# Patient Record
Sex: Female | Born: 1945 | Race: Black or African American | Hispanic: No | Marital: Single | Smoking: Former smoker
Health system: Southern US, Community
[De-identification: ages and names within clinical notes are randomized; demographics above are authoritative.]

## PROBLEM LIST (undated history)

## (undated) DIAGNOSIS — E559 Vitamin D deficiency, unspecified: Secondary | ICD-10-CM

## (undated) DIAGNOSIS — F419 Anxiety disorder, unspecified: Secondary | ICD-10-CM

## (undated) DIAGNOSIS — F329 Major depressive disorder, single episode, unspecified: Secondary | ICD-10-CM

## (undated) DIAGNOSIS — K219 Gastro-esophageal reflux disease without esophagitis: Secondary | ICD-10-CM

## (undated) DIAGNOSIS — G309 Alzheimer's disease, unspecified: Secondary | ICD-10-CM

## (undated) DIAGNOSIS — R634 Abnormal weight loss: Secondary | ICD-10-CM

## (undated) DIAGNOSIS — E079 Disorder of thyroid, unspecified: Secondary | ICD-10-CM

## (undated) DIAGNOSIS — F39 Unspecified mood [affective] disorder: Secondary | ICD-10-CM

## (undated) DIAGNOSIS — M1990 Unspecified osteoarthritis, unspecified site: Secondary | ICD-10-CM

## (undated) DIAGNOSIS — F0280 Dementia in other diseases classified elsewhere without behavioral disturbance: Secondary | ICD-10-CM

## (undated) DIAGNOSIS — N189 Chronic kidney disease, unspecified: Secondary | ICD-10-CM

## (undated) DIAGNOSIS — I10 Essential (primary) hypertension: Secondary | ICD-10-CM

## (undated) DIAGNOSIS — F0390 Unspecified dementia without behavioral disturbance: Secondary | ICD-10-CM

## (undated) HISTORY — PX: ABDOMINAL HYSTERECTOMY: SHX81

---

## 2003-09-24 ENCOUNTER — Other Ambulatory Visit: Payer: Self-pay

## 2004-02-03 ENCOUNTER — Ambulatory Visit: Payer: Self-pay

## 2007-01-04 ENCOUNTER — Other Ambulatory Visit: Payer: Self-pay

## 2007-01-04 ENCOUNTER — Inpatient Hospital Stay: Payer: Self-pay | Admitting: Internal Medicine

## 2008-07-25 ENCOUNTER — Inpatient Hospital Stay: Payer: Self-pay | Admitting: *Deleted

## 2008-09-01 ENCOUNTER — Emergency Department: Payer: Self-pay | Admitting: Internal Medicine

## 2009-09-21 IMAGING — CR DG CHEST 1V PORT
1 series · 1 of 1 positions shown · non-contrast
Comparison: none

REASON FOR EXAM: cp
COMMENTS:

[view not recorded]
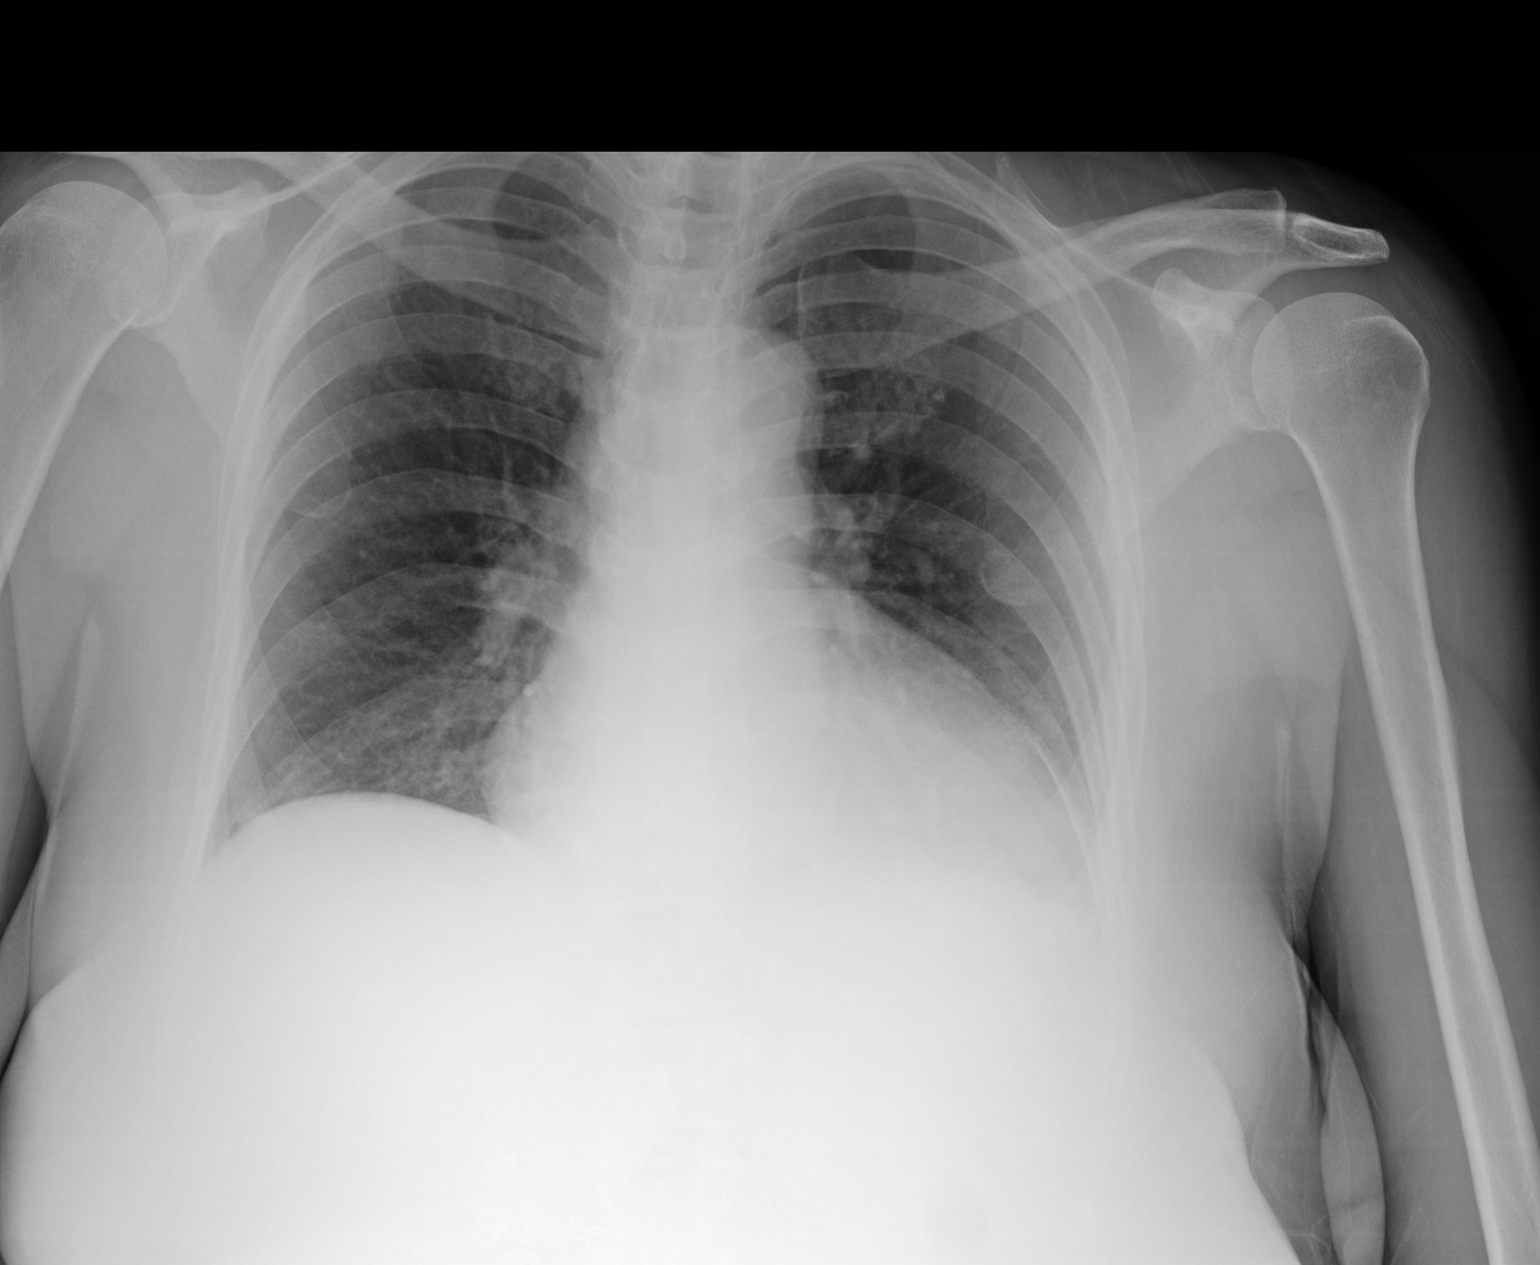

[1 of 1 positions shown; findings below may reference images not displayed]

PROCEDURE:     DXR - DXR PORTABLE CHEST SINGLE VIEW  - September 01, 2008 [DATE]

RESULT:     Comparison is made to a study of 07/25/2008.

There is a density in the left mid lung which is the inferior tip of the
left scapula. The heart is borderline enlarged. There is no edema,
infiltrate, effusion or pneumothorax.
IMPRESSION: No acute cardiopulmonary disease.

## 2010-06-21 ENCOUNTER — Emergency Department: Payer: Self-pay | Admitting: Emergency Medicine

## 2012-05-13 ENCOUNTER — Emergency Department: Payer: Self-pay | Admitting: Emergency Medicine

## 2012-05-13 LAB — URINALYSIS, COMPLETE
Blood: NEGATIVE
Hyaline Cast: 1
Leukocyte Esterase: NEGATIVE
Protein: NEGATIVE
Specific Gravity: 1.01 (ref 1.003–1.030)
Squamous Epithelial: 2
WBC UR: 1 /HPF (ref 0–5)

## 2012-05-13 LAB — COMPREHENSIVE METABOLIC PANEL
Bilirubin,Total: 0.3 mg/dL (ref 0.2–1.0)
Calcium, Total: 9.4 mg/dL (ref 8.5–10.1)
Co2: 25 mmol/L (ref 21–32)
EGFR (Non-African Amer.): 49 — ABNORMAL LOW
Glucose: 103 mg/dL — ABNORMAL HIGH (ref 65–99)
Osmolality: 280 (ref 275–301)
SGOT(AST): 20 U/L (ref 15–37)
Total Protein: 8.2 g/dL (ref 6.4–8.2)

## 2012-05-13 LAB — CBC
HCT: 36.4 % (ref 35.0–47.0)
HGB: 12 g/dL (ref 12.0–16.0)
MCH: 26.6 pg (ref 26.0–34.0)
Platelet: 243 10*3/uL (ref 150–440)
WBC: 7 10*3/uL (ref 3.6–11.0)

## 2012-05-13 LAB — TROPONIN I: Troponin-I: <0.02 ng/mL

## 2012-05-13 LAB — LIPASE, BLOOD: Lipase: 148 U/L (ref 73–393)

## 2012-08-08 ENCOUNTER — Emergency Department: Payer: Self-pay | Admitting: Unknown Physician Specialty

## 2012-08-08 LAB — CBC
HCT: 35.1 % (ref 35.0–47.0)
HGB: 11.7 g/dL — ABNORMAL LOW (ref 12.0–16.0)
MCHC: 33.3 g/dL (ref 32.0–36.0)
MCV: 82 fL (ref 80–100)
Platelet: 218 10*3/uL (ref 150–440)
RBC: 4.3 10*6/uL (ref 3.80–5.20)
RDW: 13.4 % (ref 11.5–14.5)
WBC: 5.9 10*3/uL (ref 3.6–11.0)

## 2012-08-08 LAB — URINALYSIS, COMPLETE
Glucose,UR: NEGATIVE mg/dL (ref 0–75)
Ketone: NEGATIVE
Nitrite: NEGATIVE
Protein: NEGATIVE
Specific Gravity: 1.012 (ref 1.003–1.030)
Squamous Epithelial: 5
WBC UR: 2 /HPF (ref 0–5)

## 2012-08-08 LAB — COMPREHENSIVE METABOLIC PANEL
Alkaline Phosphatase: 117 U/L (ref 50–136)
Anion Gap: 7 (ref 7–16)
Bilirubin,Total: 0.3 mg/dL (ref 0.2–1.0)
Calcium, Total: 9.1 mg/dL (ref 8.5–10.1)
Chloride: 103 mmol/L (ref 98–107)
EGFR (African American): 50 — ABNORMAL LOW
Osmolality: 279 (ref 275–301)
Potassium: 3.3 mmol/L — ABNORMAL LOW (ref 3.5–5.1)
SGOT(AST): 20 U/L (ref 15–37)
Total Protein: 7.8 g/dL (ref 6.4–8.2)

## 2012-08-08 LAB — TROPONIN I: Troponin-I: <0.02 ng/mL

## 2012-08-08 LAB — T4, FREE: Free Thyroxine: 0.98 ng/dL (ref 0.76–1.46)

## 2012-08-08 LAB — PHENYTOIN LEVEL, TOTAL: Dilantin: 4.3 ug/mL — ABNORMAL LOW (ref 10.0–20.0)

## 2012-08-08 LAB — MAGNESIUM: Magnesium: 1.6 mg/dL — ABNORMAL LOW

## 2012-09-10 ENCOUNTER — Emergency Department: Payer: Self-pay | Admitting: Emergency Medicine

## 2012-09-10 LAB — COMPREHENSIVE METABOLIC PANEL
Albumin: 4 g/dL (ref 3.4–5.0)
Anion Gap: 10 (ref 7–16)
Bilirubin,Total: 0.2 mg/dL (ref 0.2–1.0)
Calcium, Total: 9.2 mg/dL (ref 8.5–10.1)
Chloride: 104 mmol/L (ref 98–107)
Co2: 24 mmol/L (ref 21–32)
EGFR (African American): 55 — ABNORMAL LOW
EGFR (Non-African Amer.): 48 — ABNORMAL LOW
Potassium: 3.3 mmol/L — ABNORMAL LOW (ref 3.5–5.1)
SGOT(AST): 17 U/L (ref 15–37)
SGPT (ALT): 15 U/L (ref 12–78)
Total Protein: 7.7 g/dL (ref 6.4–8.2)

## 2012-09-10 LAB — CBC
HGB: 11 g/dL — ABNORMAL LOW (ref 12.0–16.0)
MCH: 27 pg (ref 26.0–34.0)
MCV: 81 fL (ref 80–100)
Platelet: 215 10*3/uL (ref 150–440)
RBC: 4.07 10*6/uL (ref 3.80–5.20)
RDW: 13.6 % (ref 11.5–14.5)

## 2012-09-10 LAB — URINALYSIS, COMPLETE
Bilirubin,UR: NEGATIVE
Glucose,UR: NEGATIVE mg/dL (ref 0–75)
Leukocyte Esterase: NEGATIVE
Nitrite: NEGATIVE
RBC,UR: 1 /HPF (ref 0–5)
Specific Gravity: 1.008 (ref 1.003–1.030)
Squamous Epithelial: <1
WBC UR: <1 /HPF (ref 0–5)

## 2012-09-10 LAB — LIPASE, BLOOD: Lipase: 146 U/L (ref 73–393)

## 2013-11-07 DIAGNOSIS — R630 Anorexia: Secondary | ICD-10-CM | POA: Insufficient documentation

## 2013-11-07 DIAGNOSIS — L309 Dermatitis, unspecified: Secondary | ICD-10-CM | POA: Insufficient documentation

## 2013-11-21 DIAGNOSIS — F329 Major depressive disorder, single episode, unspecified: Secondary | ICD-10-CM | POA: Insufficient documentation

## 2013-11-21 DIAGNOSIS — E559 Vitamin D deficiency, unspecified: Secondary | ICD-10-CM | POA: Insufficient documentation

## 2013-11-21 DIAGNOSIS — F32A Depression, unspecified: Secondary | ICD-10-CM | POA: Insufficient documentation

## 2014-03-25 DIAGNOSIS — K649 Unspecified hemorrhoids: Secondary | ICD-10-CM | POA: Insufficient documentation

## 2014-03-25 DIAGNOSIS — G4700 Insomnia, unspecified: Secondary | ICD-10-CM | POA: Insufficient documentation

## 2014-03-25 DIAGNOSIS — K5900 Constipation, unspecified: Secondary | ICD-10-CM | POA: Insufficient documentation

## 2014-05-19 ENCOUNTER — Emergency Department: Payer: Self-pay | Admitting: Emergency Medicine

## 2014-06-10 ENCOUNTER — Emergency Department: Payer: Self-pay | Admitting: Emergency Medicine

## 2014-06-26 ENCOUNTER — Encounter (HOSPITAL_COMMUNITY): Payer: Self-pay | Admitting: Emergency Medicine

## 2014-06-26 ENCOUNTER — Inpatient Hospital Stay (HOSPITAL_COMMUNITY)
Admission: EM | Admit: 2014-06-26 | Discharge: 2014-07-02 | DRG: 391 | Disposition: A | Discharge status: Skilled Nursing Facility | Payer: Medicare Other | Attending: Family Medicine | Admitting: Family Medicine

## 2014-06-26 DIAGNOSIS — G309 Alzheimer's disease, unspecified: Secondary | ICD-10-CM | POA: Diagnosis present

## 2014-06-26 DIAGNOSIS — E870 Hyperosmolality and hypernatremia: Secondary | ICD-10-CM | POA: Diagnosis present

## 2014-06-26 DIAGNOSIS — Z681 Body mass index (BMI) 19 or less, adult: Secondary | ICD-10-CM

## 2014-06-26 DIAGNOSIS — R1312 Dysphagia, oropharyngeal phase: Principal | ICD-10-CM | POA: Diagnosis present

## 2014-06-26 DIAGNOSIS — N182 Chronic kidney disease, stage 2 (mild): Secondary | ICD-10-CM | POA: Diagnosis present

## 2014-06-26 DIAGNOSIS — F0390 Unspecified dementia without behavioral disturbance: Secondary | ICD-10-CM | POA: Diagnosis present

## 2014-06-26 DIAGNOSIS — R109 Unspecified abdominal pain: Secondary | ICD-10-CM

## 2014-06-26 DIAGNOSIS — K3184 Gastroparesis: Secondary | ICD-10-CM | POA: Diagnosis present

## 2014-06-26 DIAGNOSIS — K219 Gastro-esophageal reflux disease without esophagitis: Secondary | ICD-10-CM | POA: Diagnosis present

## 2014-06-26 DIAGNOSIS — R627 Adult failure to thrive: Secondary | ICD-10-CM

## 2014-06-26 DIAGNOSIS — E039 Hypothyroidism, unspecified: Secondary | ICD-10-CM | POA: Diagnosis present

## 2014-06-26 DIAGNOSIS — M1990 Unspecified osteoarthritis, unspecified site: Secondary | ICD-10-CM | POA: Diagnosis present

## 2014-06-26 DIAGNOSIS — K2960 Other gastritis without bleeding: Secondary | ICD-10-CM | POA: Diagnosis present

## 2014-06-26 DIAGNOSIS — E860 Dehydration: Secondary | ICD-10-CM | POA: Diagnosis present

## 2014-06-26 DIAGNOSIS — D649 Anemia, unspecified: Secondary | ICD-10-CM | POA: Diagnosis present

## 2014-06-26 DIAGNOSIS — E876 Hypokalemia: Secondary | ICD-10-CM | POA: Diagnosis present

## 2014-06-26 DIAGNOSIS — Z90710 Acquired absence of both cervix and uterus: Secondary | ICD-10-CM

## 2014-06-26 DIAGNOSIS — E43 Unspecified severe protein-calorie malnutrition: Secondary | ICD-10-CM | POA: Diagnosis present

## 2014-06-26 DIAGNOSIS — F0281 Dementia in other diseases classified elsewhere with behavioral disturbance: Secondary | ICD-10-CM | POA: Diagnosis present

## 2014-06-26 DIAGNOSIS — F419 Anxiety disorder, unspecified: Secondary | ICD-10-CM | POA: Diagnosis present

## 2014-06-26 DIAGNOSIS — R1084 Generalized abdominal pain: Secondary | ICD-10-CM

## 2014-06-26 DIAGNOSIS — R4702 Dysphasia: Secondary | ICD-10-CM | POA: Diagnosis present

## 2014-06-26 DIAGNOSIS — I129 Hypertensive chronic kidney disease with stage 1 through stage 4 chronic kidney disease, or unspecified chronic kidney disease: Secondary | ICD-10-CM | POA: Diagnosis present

## 2014-06-26 DIAGNOSIS — Z7982 Long term (current) use of aspirin: Secondary | ICD-10-CM

## 2014-06-26 HISTORY — DX: Essential (primary) hypertension: I10

## 2014-06-26 HISTORY — DX: Vitamin D deficiency, unspecified: E55.9

## 2014-06-26 HISTORY — DX: Alzheimer's disease, unspecified: G30.9

## 2014-06-26 HISTORY — DX: Abnormal weight loss: R63.4

## 2014-06-26 HISTORY — DX: Chronic kidney disease, unspecified: N18.9

## 2014-06-26 HISTORY — DX: Disorder of thyroid, unspecified: E07.9

## 2014-06-26 HISTORY — DX: Unspecified osteoarthritis, unspecified site: M19.90

## 2014-06-26 HISTORY — DX: Dementia in other diseases classified elsewhere, unspecified severity, without behavioral disturbance, psychotic disturbance, mood disturbance, and anxiety: F02.80

## 2014-06-26 HISTORY — DX: Gastro-esophageal reflux disease without esophagitis: K21.9

## 2014-06-26 LAB — COMPREHENSIVE METABOLIC PANEL
ALT: 14 U/L (ref 0–35)
AST: 24 U/L (ref 0–37)
Albumin: 4 g/dL (ref 3.5–5.2)
Alkaline Phosphatase: 56 U/L (ref 39–117)
Anion gap: 10 (ref 5–15)
BUN: 27 mg/dL — ABNORMAL HIGH (ref 6–23)
CO2: 27 mmol/L (ref 19–32)
Calcium: 9.2 mg/dL (ref 8.4–10.5)
Chloride: 111 mmol/L (ref 96–112)
Creatinine, Ser: 0.96 mg/dL (ref 0.50–1.10)
GFR calc Af Amer: 68 mL/min — ABNORMAL LOW (ref >90)
GFR calc non Af Amer: 59 mL/min — ABNORMAL LOW (ref >90)
Glucose, Bld: 139 mg/dL — ABNORMAL HIGH (ref 70–99)
Potassium: 3.5 mmol/L (ref 3.5–5.1)
Sodium: 148 mmol/L — ABNORMAL HIGH (ref 135–145)
Total Bilirubin: 0.6 mg/dL (ref 0.3–1.2)
Total Protein: 7.3 g/dL (ref 6.0–8.3)

## 2014-06-26 LAB — CBC WITH DIFFERENTIAL/PLATELET
Basophils Absolute: 0 10*3/uL (ref 0.0–0.1)
Basophils Relative: 0 % (ref 0–1)
Eosinophils Absolute: 0 10*3/uL (ref 0.0–0.7)
Eosinophils Relative: 0 % (ref 0–5)
HCT: 35.5 % — ABNORMAL LOW (ref 36.0–46.0)
Hemoglobin: 11.3 g/dL — ABNORMAL LOW (ref 12.0–15.0)
Lymphocytes Relative: 14 % (ref 12–46)
Lymphs Abs: 0.9 10*3/uL (ref 0.7–4.0)
MCH: 25.7 pg — ABNORMAL LOW (ref 26.0–34.0)
MCHC: 31.8 g/dL (ref 30.0–36.0)
MCV: 80.9 fL (ref 78.0–100.0)
Monocytes Absolute: 0.4 10*3/uL (ref 0.1–1.0)
Monocytes Relative: 7 % (ref 3–12)
Neutro Abs: 4.8 10*3/uL (ref 1.7–7.7)
Neutrophils Relative %: 79 % — ABNORMAL HIGH (ref 43–77)
Platelets: 241 10*3/uL (ref 150–400)
RBC: 4.39 MIL/uL (ref 3.87–5.11)
RDW: 12.6 % (ref 11.5–15.5)
WBC: 6.1 10*3/uL (ref 4.0–10.5)

## 2014-06-26 LAB — URINALYSIS, ROUTINE W REFLEX MICROSCOPIC
Glucose, UA: NEGATIVE mg/dL
Ketones, ur: 15 mg/dL — AB
Leukocytes, UA: NEGATIVE
Nitrite: NEGATIVE
Protein, ur: NEGATIVE mg/dL
Specific Gravity, Urine: 1.025 (ref 1.005–1.030)
Urobilinogen, UA: 0.2 mg/dL (ref 0.0–1.0)
pH: 5.5 (ref 5.0–8.0)

## 2014-06-26 LAB — URINE MICROSCOPIC-ADD ON

## 2014-06-26 LAB — MAGNESIUM: Magnesium: 2.1 mg/dL (ref 1.5–2.5)

## 2014-06-26 MED ORDER — SODIUM CHLORIDE 0.9 % IV SOLN
INTRAVENOUS | Status: DC
  Administered 2014-06-27: 11:00:00 via INTRAVENOUS

## 2014-06-26 MED ORDER — ACETAMINOPHEN 325 MG PO TABS: 650.0000 mg | ORAL_TABLET | Freq: Four times a day (QID) | ORAL | Status: DC | PRN

## 2014-06-26 MED ORDER — ACETAMINOPHEN 650 MG RE SUPP
650.0000 mg | Freq: Four times a day (QID) | RECTAL | Status: DC | PRN
  Administered 2014-06-27 – 2014-06-28 (×2): 650 mg via RECTAL
  Filled 2014-06-26 (×2): qty 1

## 2014-06-26 MED ORDER — DEXTROSE-NACL 5-0.9 % IV SOLN
INTRAVENOUS | Status: DC
  Administered 2014-06-26: 22:00:00 via INTRAVENOUS

## 2014-06-26 MED ORDER — ONDANSETRON HCL 4 MG/2ML IJ SOLN: 4.0000 mg | Freq: Four times a day (QID) | INTRAMUSCULAR | Status: DC | PRN

## 2014-06-26 MED ORDER — LORAZEPAM 2 MG/ML IJ SOLN
0.2500 mg | Freq: Three times a day (TID) | INTRAMUSCULAR | Status: DC | PRN
  Administered 2014-06-27 – 2014-06-30 (×5): 0.25 mg via INTRAVENOUS
  Filled 2014-06-26 (×6): qty 1

## 2014-06-26 MED ORDER — ONDANSETRON HCL 4 MG PO TABS: 4.0000 mg | ORAL_TABLET | Freq: Four times a day (QID) | ORAL | Status: DC | PRN

## 2014-06-26 MED ORDER — LEVOTHYROXINE SODIUM 100 MCG IV SOLR
87.5000 ug | Freq: Every day | INTRAVENOUS | Status: DC
  Administered 2014-06-27 – 2014-06-28 (×2): 87.5 ug via INTRAVENOUS
  Filled 2014-06-26 (×4): qty 5

## 2014-06-26 MED ORDER — SODIUM CHLORIDE 0.9 % IV BOLUS (SEPSIS)
1000.0000 mL | Freq: Once | INTRAVENOUS | Status: AC
  Administered 2014-06-26: 1000 mL via INTRAVENOUS

## 2014-06-26 NOTE — H&P (Signed)
[facility] History and Physical  Dana Grimes ZOX:[id number] DOB: Jan 10, 1946 DOA: 06/26/2014  Referring physician: ER physician. PCP: No primary care provider on file.   History obtained from patient's daughter and son.  Chief Complaint: Poor oral intake.  HPI: Dana Grimes is a 69 y.o. female with history of advanced dementia, hypothyroidism was brought to the ER after patient has been having poor oral intake over the last 2 weeks. As per the family patient did not have any nausea vomiting abdominal pain or diarrhea fever chills chest pain or shortness of breath. At this time patient's family is requesting feeding tube and ER physician had consulted on-call gastroenterologist Dr. Minus Liberty was planning to place PEG tube. Patient has been admitted for further management.   Review of Systems: As presented in the history of presenting illness, rest negative.  Past Medical History  Diagnosis Date  . Hypertension   . Chronic kidney disease   . Thyroid disease     hypothyroidism  . Gastric reflux   . Alzheimer's dementia   . Arthritis   . Vitamin D deficiency   . Abnormal weight loss    Past Surgical History  Procedure Laterality Date  . Abdominal hysterectomy     Social History:  reports that she has never smoked. She does not have any smokeless tobacco history on file. She reports that she does not drink alcohol. Her drug history is not on file. Where does patient live nursing home. Can patient participate in ADLs? No.  No Known Allergies  Family History: History reviewed. No pertinent family history.    Prior to Admission medications   Medication Sig Start Date End Date Taking? Authorizing Provider  ALPRAZolam (XANAX) 0.25 MG tablet Take 0.25 mg by mouth 3 (three) times daily as needed for anxiety.   Yes Historical Provider, MD  aspirin 81 MG chewable tablet Chew 81 mg by mouth daily.   Yes Historical Provider, MD  cholecalciferol (VITAMIN D) 1000 UNITS tablet  Take 1,000 Units by mouth every morning.   Yes Historical Provider, MD  levothyroxine (SYNTHROID, LEVOTHROID) 175 MCG tablet Take 175 mcg by mouth every morning.   Yes Historical Provider, MD  polyethylene glycol powder (GLYCOLAX/MIRALAX) powder Take 17 g by mouth every Monday, Wednesday, and Friday. *Mixed in 8 oz of water/beverage and drink daily on M-W-F   Yes Historical Provider, MD  risperiDONE (RISPERDAL) 2 MG tablet Take 2-4 mg by mouth 2 (two) times daily. Take one tablet daily and two tablets at bedtime   Yes Historical Provider, MD    Physical Exam: Filed Vitals:   06/26/14 1510 06/26/14 1715 06/26/14 1936  BP: 146/83 118/102 132/98  Pulse: 85 73 63  Temp: 98 F (36.7 C)    TempSrc: Oral    Resp: Height:  (1.702 m)    Weight: 49.896 kg (110 lb)    SpO2: 100% 97% 98%     General:  Moderately built and nourished.  Eyes: Anicteric no pallor.  ENT: No discharge from the ears eyes nose and mouth.  Neck: No mass felt.  Cardiovascular: S1-S2 heard.  Respiratory: No rhonchi or crepitations.  Abdomen: Soft nontender bowel sounds present.  Skin: No rash.  Musculoskeletal: No edema.  Psychiatric: Patient is demented.  Neurologic: Alert awake moves all extremities.  Labs on Admission:  Basic Metabolic Panel:  Recent Labs Lab 06/26/14 1750  NA 148*  K 3.5  CL 111  CO2 27  GLUCOSE 139*  BUN  27*  CREATININE 0.96  CALCIUM 9.2  MG 2.1   Liver Function Tests:  Recent Labs Lab 06/26/14 1750  AST 24  ALT 14  ALKPHOS 56  BILITOT 0.6  PROT 7.3  ALBUMIN 4.0   No results for input(s): LIPASE, AMYLASE in the last 168 hours. No results for input(s): AMMONIA in the last 168 hours. CBC:  Recent Labs Lab 06/26/14 1750  WBC 6.1  NEUTROABS 4.8  HGB 11.3*  HCT 35.5*  MCV 80.9  PLT 241   Cardiac Enzymes: No results for input(s): CKTOTAL, CKMB, CKMBINDEX, TROPONINI in the last 168 hours.  BNP (last 3 results) No results for input(s): BNP  in the last 8760 hours.  ProBNP (last 3 results) No results for input(s): PROBNP in the last 8760 hours.  CBG: No results for input(s): GLUCAP in the last 168 hours.  Radiological Exams on Admission: No results found.   Assessment/Plan Principal Problem:   Failure to thrive in adult Active Problems:   Dehydration   Hypernatremia   Dementia   Hypothyroidism   1. Poor by mouth intake with failure to thrive - gastroenterologist Dr. Minus Libertyahman has been consulted and plan is to keep patient nothing by mouth after midnight in anticipation of PEG tube placement. 2. Dehydration with hypernatremia - patient has been placed on D5 normal saline follow metabolic panel. 3. History of dementia - patient is usually on Risperdal which family does not patient to be on. I placed patient on when necessary Ativan for agitation. 4. Anemia - follow CBC. 5. Hypothyroidism - IV Synthroid until patient can take orally.   DVT Prophylaxis SCDs in anticipation of procedure. Code Status: Full code.  Family Communication: Patient's daughter and son at the bedside.  Disposition Plan: Admit for observation.    Dionicio Shelnutt N. [facility] Pager [phone].  If 7PM-7AM, please contact night-coverage [email or web address] Password [email or web address] 06/26/2014, 8:34 PM

## 2014-06-26 NOTE — ED Notes (Signed)
Attempted to place IV in right forearm without success.  Family remains at bedside.

## 2014-06-26 NOTE — ED Notes (Signed)
Per EMS, pt was admitted to [facility] x2 days ago. Per EMS and facility staff, pt not eating and not talking. Per pt family , pt non-verbal and at baseline. CBG 85 en route.

## 2014-06-26 NOTE — ED Provider Notes (Signed)
CSN: [id number]     Arrival date & time 06/26/14  1508 History  This chart was scribed for Raeford Razor, MD by Bronson Curb, ED Scribe. This patient was seen in room APA11/APA11 and the patient's care was started at 4:22 PM.    Chief Complaint  Patient presents with  . Failure To Thrive   LEVEL 5 CAVEAT: DEMENTIA  History provided by: daughter. The history is limited by the condition of the patient. No language interpreter was used.     HPI Comments: Dana Grimes is a 69 y.o. female, with history of HTN, CKD, hypothyroidism, Alzheimer's dementia, who presents to the Emergency Department brought by ambulance for failure to thrive for the past 2 days. Per daughter, patient is from [facility] and for the past week patient has not been eating. She reports she is just starting to take a small amount of fluids. Daughter reports the patient started to decline after being admitted to [facility] 2 days ago, however, she was not eating prior to being admitted. Daughter reports the patient was on risperidone, but states the PA thought she was taking too much and the patient was not given this medication last night. Daughter reports the patient has been awake throughout the night last night. She also reports the patient is no longer holding her head up. She denies vomiting. Daughter states the patient is nonverbal and unable to communicate pain.  Past Medical History  Diagnosis Date  . Hypertension   . Chronic kidney disease   . Thyroid disease     hypothyroidism  . Gastric reflux   . Alzheimer's dementia   . Arthritis   . Vitamin D deficiency   . Abnormal weight loss    History reviewed. No pertinent past surgical history. History reviewed. No pertinent family history. History  Substance Use Topics  . Smoking status: Unknown If Ever Smoked  . Smokeless tobacco: Not on file  . Alcohol Use: No   OB History    No data available     Review of Systems  Unable to perform ROS:  Dementia      Allergies  Review of patient's allergies indicates not on file.  Home Medications   Prior to Admission medications   Not on File   Triage Vitals: BP 146/83 mmHg  Pulse 85  Temp(Src) 98 F (36.7 C) (Oral)  Resp 18  Ht  (1.702 m)  Wt 110 lb (49.896 kg)  BMI 17.22 kg/m2  SpO2 100%  Physical Exam  Constitutional: She appears ill.  Frail and chronically ill appearing. Laying in bed with neck held forward in flexion. Essentially non verbal. Moans/mutters but nonsensical. Does not follow commands.  HENT:  Head: Normocephalic and atraumatic.  Could not adequately view oropharynx. Pt clenching teeth.   Eyes: Conjunctivae are normal. Right eye exhibits no discharge. Left eye exhibits no discharge.  Neck: Neck supple.  Cardiovascular: Normal rate, regular rhythm and normal heart sounds.  Exam reveals no gallop and no friction rub.   No murmur heard. Pulmonary/Chest: Effort normal and breath sounds normal. No respiratory distress.  Abdominal: Soft. She exhibits no distension. There is no tenderness.  Musculoskeletal: She exhibits no edema or tenderness.  Skin: Skin is warm and dry.  Psychiatric: She has a normal mood and affect. Her behavior is normal. Thought content normal.  Nursing note and vitals reviewed.   ED Course  Procedures (including critical care time)  DIAGNOSTIC STUDIES: Oxygen Saturation is 100% on room air, normal  by my interpretation.    COORDINATION OF CARE: 4:34 PM - Discussed treatment plan with daughter at bedside which includes possible admission and daughter agreed to plan.   Labs Review Labs Reviewed  CBC WITH DIFFERENTIAL/PLATELET - Abnormal; Notable for the following:    Hemoglobin 11.3 (*)    HCT 35.5 (*)    MCH 25.7 (*)    Neutrophils Relative % 79 (*)    All other components within normal limits  COMPREHENSIVE METABOLIC PANEL - Abnormal; Notable for the following:    Sodium 148 (*)    Glucose, Bld 139 (*)    BUN 27 (*)     GFR calc non Af Amer 59 (*)    GFR calc Af Amer 68 (*)    All other components within normal limits  URINALYSIS, ROUTINE W REFLEX MICROSCOPIC - Abnormal; Notable for the following:    Hgb urine dipstick TRACE (*)    Bilirubin Urine SMALL (*)    Ketones, ur 15 (*)    All other components within normal limits  URINE MICROSCOPIC-ADD ON - Abnormal; Notable for the following:    Casts HYALINE CASTS (*)    All other components within normal limits  MAGNESIUM    Imaging Review No results found.   EKG Interpretation None      MDM   Final diagnoses:  Dehydration  Failure to thrive in adult    69yF with advanced dementia and failure to thrive. Long discussion with daughter at bedside concerning goals of care. Unfortunately, cannot "fix" underlying issue and this is likely a manifestation of its progression. Daughter not interested in hospice care at this time. Requesting IV placement and IVF. Likely only temporizing measure and will likely end up needing feeding tube. Daughter says she has had previous discussions with siblings and they would like to pursue this. Will discuss with GI. Not currently establish with gastroenterology. With it now being Friday evening, I'm not sure how expeditiously she can be evaluated as an outpatient and then have procedure scheduled. Will discuss with GI.  Discussed with Dr Karilyn Cotaehman, GI. He can placed PEG tomorrow. Requesting NPO after midnight and no heparin.   I personally preformed the services scribed in my presence. The recorded information has been reviewed is accurate. Raeford RazorStephen Lidie Glade, MD.     Raeford RazorStephen Michaelah Credeur, MD 06/26/14 [phone]

## 2014-06-26 NOTE — ED Notes (Signed)
Patient is resting comfortably. 

## 2014-06-27 ENCOUNTER — Encounter (HOSPITAL_COMMUNITY)
Admission: EM | Disposition: A | Discharge status: Skilled Nursing Facility | Payer: Self-pay | Source: Home / Self Care | Attending: Internal Medicine

## 2014-06-27 DIAGNOSIS — E860 Dehydration: Secondary | ICD-10-CM

## 2014-06-27 DIAGNOSIS — E870 Hyperosmolality and hypernatremia: Secondary | ICD-10-CM

## 2014-06-27 DIAGNOSIS — F0390 Unspecified dementia without behavioral disturbance: Secondary | ICD-10-CM

## 2014-06-27 DIAGNOSIS — K2950 Unspecified chronic gastritis without bleeding: Secondary | ICD-10-CM

## 2014-06-27 DIAGNOSIS — R627 Adult failure to thrive: Secondary | ICD-10-CM | POA: Diagnosis not present

## 2014-06-27 DIAGNOSIS — R1312 Dysphagia, oropharyngeal phase: Secondary | ICD-10-CM

## 2014-06-27 DIAGNOSIS — E43 Unspecified severe protein-calorie malnutrition: Secondary | ICD-10-CM | POA: Insufficient documentation

## 2014-06-27 DIAGNOSIS — R634 Abnormal weight loss: Secondary | ICD-10-CM

## 2014-06-27 HISTORY — PX: PEG PLACEMENT: SHX5437

## 2014-06-27 LAB — CBC WITH DIFFERENTIAL/PLATELET
Basophils Absolute: 0 10*3/uL (ref 0.0–0.1)
Basophils Relative: 0 % (ref 0–1)
EOS ABS: 0.1 10*3/uL (ref 0.0–0.7)
Eosinophils Relative: 1 % (ref 0–5)
HEMATOCRIT: 32.5 % — AB (ref 36.0–46.0)
Hemoglobin: 10.4 g/dL — ABNORMAL LOW (ref 12.0–15.0)
LYMPHS ABS: 1 10*3/uL (ref 0.7–4.0)
Lymphocytes Relative: 15 % (ref 12–46)
MCH: 25.8 pg — ABNORMAL LOW (ref 26.0–34.0)
MCHC: 32 g/dL (ref 30.0–36.0)
MCV: 80.6 fL (ref 78.0–100.0)
MONO ABS: 0.6 10*3/uL (ref 0.1–1.0)
MONOS PCT: 9 % (ref 3–12)
NEUTROS ABS: 5 10*3/uL (ref 1.7–7.7)
Neutrophils Relative %: 75 % (ref 43–77)
Platelets: 203 10*3/uL (ref 150–400)
RBC: 4.03 MIL/uL (ref 3.87–5.11)
RDW: 12.6 % (ref 11.5–15.5)
WBC: 6.6 10*3/uL (ref 4.0–10.5)

## 2014-06-27 LAB — COMPREHENSIVE METABOLIC PANEL
ALBUMIN: 3.3 g/dL — AB (ref 3.5–5.2)
ALT: 12 U/L (ref 0–35)
AST: 19 U/L (ref 0–37)
Alkaline Phosphatase: 49 U/L (ref 39–117)
Anion gap: 6 (ref 5–15)
BUN: 18 mg/dL (ref 6–23)
CO2: 27 mmol/L (ref 19–32)
Calcium: 8.7 mg/dL (ref 8.4–10.5)
Chloride: 116 mmol/L — ABNORMAL HIGH (ref 96–112)
Creatinine, Ser: 0.83 mg/dL (ref 0.50–1.10)
GFR calc Af Amer: 82 mL/min — ABNORMAL LOW (ref >90)
GFR calc non Af Amer: 70 mL/min — ABNORMAL LOW (ref >90)
GLUCOSE: 111 mg/dL — AB (ref 70–99)
Potassium: 3.5 mmol/L (ref 3.5–5.1)
Sodium: 149 mmol/L — ABNORMAL HIGH (ref 135–145)
TOTAL PROTEIN: 6 g/dL (ref 6.0–8.3)
Total Bilirubin: 0.6 mg/dL (ref 0.3–1.2)

## 2014-06-27 LAB — SURGICAL PCR SCREEN
MRSA, PCR: NEGATIVE
STAPHYLOCOCCUS AUREUS: NEGATIVE

## 2014-06-27 LAB — GLUCOSE, CAPILLARY
GLUCOSE-CAPILLARY: 88 mg/dL (ref 70–99)
Glucose-Capillary: 104 mg/dL — ABNORMAL HIGH (ref 70–99)
Glucose-Capillary: 104 mg/dL — ABNORMAL HIGH (ref 70–99)
Glucose-Capillary: 90 mg/dL (ref 70–99)

## 2014-06-27 LAB — TSH: TSH: 0.013 u[IU]/mL — AB (ref 0.350–4.500)

## 2014-06-27 LAB — PROTIME-INR
INR: 1.29 (ref 0.00–1.49)
Prothrombin Time: 16.3 seconds — ABNORMAL HIGH (ref 11.6–15.2)

## 2014-06-27 SURGERY — INSERTION, PEG TUBE
Anesthesia: Moderate Sedation

## 2014-06-27 MED ORDER — CEFAZOLIN SODIUM 1-5 GM-% IV SOLN
1.0000 g | Freq: Once | INTRAVENOUS | Status: AC
  Administered 2014-06-27: 1 g via INTRAVENOUS
  Filled 2014-06-27: qty 50

## 2014-06-27 MED ORDER — DEXTROSE-NACL 5-0.45 % IV SOLN: INTRAVENOUS | Status: DC

## 2014-06-27 MED ORDER — MIDAZOLAM HCL 5 MG/5ML IJ SOLN
INTRAMUSCULAR | Status: DC | PRN
  Administered 2014-06-27 (×2): 1 mg via INTRAVENOUS

## 2014-06-27 MED ORDER — SODIUM CHLORIDE 0.9 % IR SOLN
50.0000 mL | Status: DC
  Administered 2014-06-27: 50 mL

## 2014-06-27 MED ORDER — MEPERIDINE HCL 50 MG/ML IJ SOLN
INTRAMUSCULAR | Status: AC
  Filled 2014-06-27: qty 1

## 2014-06-27 MED ORDER — DEXTROSE-NACL 5-0.45 % IV SOLN
INTRAVENOUS | Status: DC
  Administered 2014-06-27 – 2014-06-28 (×2): via INTRAVENOUS

## 2014-06-27 MED ORDER — MIDAZOLAM HCL 5 MG/5ML IJ SOLN
INTRAMUSCULAR | Status: AC
  Filled 2014-06-27: qty 10

## 2014-06-27 NOTE — Progress Notes (Signed)
[facility] PROGRESS NOTE  Dana Grimes UJW:[id number] DOB: 10/30/1945 DOA: 06/26/2014 PCP: No primary care provider on file.  Assessment/Plan: 1. Failure to thrive with poor by mouth intake. Status post PEG tube placement byGastroenterology. Will start using to overfeeding once cleared by gastroenterology. 2. Dehydration. Continue IV fluids 3. Hypernatremia. Will change IV fluids to D5 half-normal saline. 4. Severe malnutrition. Being seen by nutrition 5. Hypothyroidism, on IV Synthroid until able to use PEG tube. 6. Advanced dementia.  Code Status: full code Family Communication: no family present Disposition Plan: discharge home once improved   Consultants:  Gastroenterology, Dr. Karilyn Cotaehman  Procedures:  PEG tube placement 3/26  Antibiotics:    HPI/Subjective: Patient does not open eyes to voice, speech is incoherent  Objective: Filed Vitals:   06/27/14 1520  BP: 137/68  Pulse: 54  Temp:   Resp: 16    Intake/Output Summary (Last 24 hours) at 06/27/14 1850 Last data filed at 06/27/14 1300  Gross per 24 hour  Intake      0 ml  Output      0 ml  Net      0 ml   Filed Weights   06/26/14 1510 06/27/14 0454  Weight: 49.896 kg (110 lb) 41.6 kg (91 lb 11.4 oz)    Exam:   General:  NAD, confused  Cardiovascular: s1, s2,rrr  Respiratory: cta b  Abdomen: soft, nt, bs+, PEG tube in place  Musculoskeletal: no edema b/l   Data Reviewed: Basic Metabolic Panel:  Recent Labs Lab 06/26/14 1750 06/27/14 0657  NA 148* 149*  K 3.5 3.5  CL 111 116*  CO2 27 27  GLUCOSE 139* 111*  BUN 27* 18  CREATININE 0.96 0.83  CALCIUM 9.2 8.7  MG 2.1  --    Liver Function Tests:  Recent Labs Lab 06/26/14 1750 06/27/14 0657  AST 24 19  ALT 14 12  ALKPHOS 56 49  BILITOT 0.6 0.6  PROT 7.3 6.0  ALBUMIN 4.0 3.3*   No results for input(s): LIPASE, AMYLASE in the last 168 hours. No results for input(s): AMMONIA in the last 168 hours. CBC:  Recent  Labs Lab 06/26/14 1750 06/27/14 0657  WBC 6.1 6.6  NEUTROABS 4.8 5.0  HGB 11.3* 10.4*  HCT 35.5* 32.5*  MCV 80.9 80.6  PLT 241 203   Cardiac Enzymes: No results for input(s): CKTOTAL, CKMB, CKMBINDEX, TROPONINI in the last 168 hours. BNP (last 3 results) No results for input(s): BNP in the last 8760 hours.  ProBNP (last 3 results) No results for input(s): PROBNP in the last 8760 hours.  CBG:  Recent Labs Lab 06/27/14 0009 06/27/14 0700 06/27/14 0723 06/27/14 1146  GLUCAP 104* 90 88 104*    Recent Results (from the past 240 hour(s))  Surgical pcr screen     Status: None   Collection Time: 06/27/14  4:40 AM  Result Value Ref Range Status   MRSA, PCR NEGATIVE NEGATIVE Final   Staphylococcus aureus NEGATIVE NEGATIVE Final    Comment:        The Xpert SA Assay (FDA approved for NASAL specimens in patients over 69 years of age), is one component of a comprehensive surveillance program.  Test performance has been validated by [facility] for patients greater than or equal to 69 year old. It is not intended to diagnose infection nor to guide or monitor treatment.      Studies: No results found.  Scheduled Meds: . levothyroxine  87.5 mcg Intravenous Daily  .  midazolam       Continuous Infusions: . dextrose 5 % and 0.45% NaCl 75 mL/hr at 06/27/14 1430  . sodium chloride irrigation 50 mL (06/27/14 1801)    Principal Problem:   Failure to thrive in adult Active Problems:   Dehydration   Hypernatremia   Dementia   Hypothyroidism   Protein-calorie malnutrition, severe    Time spent:    [facility]  [facility] Pager [phone]. If 7PM-7AM, please contact night-coverage at [email or web address], password [facility] 06/27/2014, 6:50 PM  LOS: 1 day

## 2014-06-27 NOTE — Consult Note (Signed)
Referring Provider: Raeford RazorStephen Kohut, MD Primary Care Physician:  No primary care provider on file. Primary Gastroenterologist:  Dr. Karilyn Cotaehman  Reason for Consultation:    Oropharyngeal dysphagia and weight loss. Failure to thrive.  HPI:   Patient is 69 year old African-American female with 6 year history of dementia and [facility] and lately had [facility]. Patient's condition has been gradually deteriorating. According to her daughter Zella BallRobin she has lost close to 60 pounds in the last 5 years. Weight loss has accelerated over the last few weeks. About 2 weeks ago she quit eating. [facility] Staff was able to force her to take her Synthroid but oral intake has significantly decreased and finally diminished over the last few days. Patient was brought to emergency room yesterday and felt to be dehydrated with mild hypernatremia. Family has requested placement of gastrostomy tube for feeding purposes. According to her daughter Zella BallRobin this has been reviewed in the past. There is no history of nausea vomiting melena or rectal bleeding. There is also no history of fever. Patient has undergone EGD in 2010 and biopsy was negative for H. Pylori. She underwent anoscopy and 2007 and also in April 2010. Patient has 2 daughters and 1 son. Her daughter Zella BallRobin is at bedside.    Past Medical History  Diagnosis Date  . Hypertension   . Chronic kidney disease   . Thyroid disease     hypothyroidism  . Gastric reflux   . Alzheimer's dementia   . Arthritis   . Vitamin D deficiency   . Abnormal weight loss     Past Surgical History  Procedure Laterality Date  . Abdominal hysterectomy      Prior to Admission medications   Medication Sig Start Date End Date Taking? Authorizing Provider  ALPRAZolam (XANAX) 0.25 MG tablet Take 0.25 mg by mouth 3 (three) times daily as needed for anxiety.   Yes  Historical Provider, MD  aspirin 81 MG chewable tablet Chew 81 mg by mouth daily.   Yes Historical Provider, MD  cholecalciferol (VITAMIN D) 1000 UNITS tablet Take 1,000 Units by mouth every morning.   Yes Historical Provider, MD  levothyroxine (SYNTHROID, LEVOTHROID) 175 MCG tablet Take 175 mcg by mouth every morning.   Yes Historical Provider, MD  polyethylene glycol powder (GLYCOLAX/MIRALAX) powder Take 17 g by mouth every Monday, Wednesday, and Friday. *Mixed in 8 oz of water/beverage and drink daily on M-W-F   Yes Historical Provider, MD  risperiDONE (RISPERDAL) 2 MG tablet Take 2-4 mg by mouth 2 (two) times daily. Take one tablet daily and two tablets at bedtime   Yes Historical Provider, MD    Current Facility-Administered Medications  Medication Dose Route Frequency Provider Last Rate Last Dose  . 0.9 %  sodium chloride infusion   Intravenous Continuous Raeford RazorStephen Kohut, MD      . acetaminophen (TYLENOL) tablet 650 mg  650 mg Oral Q6H PRN Eduard ClosArshad N Kakrakandy, MD       Or  . acetaminophen (TYLENOL) suppository 650 mg  650 mg Rectal Q6H PRN Eduard ClosArshad N Kakrakandy, MD      . ceFAZolin (ANCEF) IVPB 1 g/50 mL premix  1 g Intravenous Once Malissa HippoNajeeb U Sible Straley, MD      . dextrose 5 %-0.9 % sodium chloride infusion   Intravenous Continuous Eduard ClosArshad N Kakrakandy, MD 75 mL/hr at 06/26/14 2141    . levothyroxine (SYNTHROID, LEVOTHROID)  injection 87.5 mcg  87.5 mcg Intravenous Daily Eduard Clos, MD   87.5 mcg at 06/26/14 2130  . LORazepam (ATIVAN) injection 0.25 mg  0.25 mg Intravenous Q8H PRN Eduard Clos, MD      . ondansetron [facility]) tablet 4 mg  4 mg Oral Q6H PRN Eduard Clos, MD       Or  . ondansetron [facility]) injection 4 mg  4 mg Intravenous Q6H PRN Eduard Clos, MD        Allergies as of 06/26/2014  . (No Known Allergies)    History reviewed. No pertinent family history.  History   Social History  . Marital Status: Single    Spouse Name: N/A  . Number of  Children: N/A  . Years of Education: N/A   Occupational History  . Not on file.   Social History Main Topics  . Smoking status: Never Smoker   . Smokeless tobacco: Not on file  . Alcohol Use: No  . Drug Use: Not on file  . Sexual Activity: Not on file   Other Topics Concern  . Not on file   Social History Narrative  . No narrative on file    Review of Systems: See HPI, otherwise normal ROS  Physical Exam: Temp:  [97.6 F (36.4 C)-98 F (36.7 C)] 97.6 F (36.4 C) (03/26 0454) Pulse Rate:  [52-85] 64 (03/26 0454) Resp:  [18-22] 18 (03/26 0454) BP: (102-156)/(75-102) 156/78 mmHg (03/26 0454) SpO2:  [97 %-100 %] 100 % (03/26 0454) Weight:  [91 lb 11.4 oz (41.6 kg)-110 lb (49.896 kg)] 91 lb 11.4 oz (41.6 kg) (03/26 0454)   Patient is well-developed thin African-American female who does not respond to local stimuli. She does verbalize but her speech is difficult to comprehend. Conjunctiva is pink. Sclera is nonicteric. Pupils are small and equal. Oropharyngeal mucosa could not be examined. Neck his to pull. No thyromegaly or lymphadenopathy. Cardiac exam with regular rhythm normal S1 and S2. No murmur or gallop noted. Lungs are clear to auscultation. Abdomen is flat with redundant skin. Bowel sounds are normal. She has lower midline scar. On palpation abdomen is soft and nontender without organomegaly or masses. Extremities are thin but no edema or clubbing noted.   Lab Results:  Recent Labs  06/26/14 1750 06/27/14 0657  WBC 6.1 6.6  HGB 11.3* 10.4*  HCT 35.5* 32.5*  PLT 241 203   BMET  Recent Labs  06/26/14 1750 06/27/14 0657  NA 148* 149*  K 3.5 3.5  CL 111 116*  CO2 27 27  GLUCOSE 139* 111*  BUN 27* 18  CREATININE 0.96 0.83  CALCIUM 9.2 8.7   LFT  Recent Labs  06/27/14 0657  PROT 6.0  ALBUMIN 3.3*  AST 19  ALT 12  ALKPHOS 49  BILITOT 0.6   PT/INR  Recent Labs  06/27/14 0657  LABPROT 16.3*  INR 1.29    Assessment;  Patient is  69 year old African-American female with 6 year history of dementia with gradual weight loss of 50 pounds who has now quit eating and family has requested placement of gastrostomy tube for feeding purposes. Patient is presently cared [facility] house in dementia unit. She will not be able to return to that facility with gastrostomy tube in place. Procedures reviewed with her daughter Zella Ball and patient's son-in-law and their in agreement.  Mild hyponatremia secondary to dehydration. Patient has anemia which appears to be chronic. It remains to be seen how much H&H dropped  with hydration.  Recommendations;  Will proceed with percutaneous endoscopic gastrostomy. Ancef 1 g IV prior to procedure.   LOS: 1 day   Diamantina Edinger U  06/27/2014, 10:32 AM

## 2014-06-27 NOTE — Progress Notes (Signed)
INITIAL NUTRITION ASSESSMENT Pt meets criteria for SEVERE MALNUTRITION in the context of CHRONIC ILLNESS as evidenced by estimated oral intake that is <75% of needs for >1 month and severe muscle wasting. DOCUMENTATION CODES Per approved criteria  -Severe malnutrition in the context of chronic illness   INTERVENTION: Once peg placed reccomend Osmolite @ 20 ml/hr via PEG and increase by 10 ml every 4 hours (or as tolerated) to goal rate of 50 ml/hr.    Tube feeding regimen provides 1800 kcal (100% of needs), 75 grams of protein, and 968 ml of H2O.   -Recommend carefully monitoring mag/phos as TF is started  NUTRITION DIAGNOSIS: Undernutrition related to dementia and Poor PO intake as evidenced by BMI of 14.4.   Goal: Pt to meet >/= 90% of their estimated nutrition needs   Monitor:  Labs, TF initiation, TF to goal, TF tolerance, weight   Reason for Assessment: MST 3  69 y.o. female  Admitting Dx: Failure to thrive in adult  ASSESSMENT: 69 y.o. female with history of advanced dementia, hypothyroidism was brought to the ER after patient has been having poor oral intake over the last 2 weeks.   Per notes: pt's family requesting feeding tube- anticipation of placement today   Pt has lost 60 pounds in last 5 years. Wt loss has accelerated over last few weeks. Quit eating 2 week ago   Will place TF recs- low fiber formula to start. Will need to be careful for refeeding syndrome  Nutrition Focused Physical Exam: N/a- given BMI assume severe muscle wasting   Height: Ht Readings from Last 1 Encounters:  06/26/14  (1.702 m)    Weight: Wt Readings from Last 1 Encounters:  06/27/14 91 lb 11.4 oz (41.6 kg)    Ideal Body Weight: 135 lbs  % Ideal Body Weight: 67%  Wt Readings from Last 10 Encounters:  06/27/14 91 lb 11.4 oz (41.6 kg)    Usual Body Weight: Unknown  BMI:  Body mass index is 14.36 kg/(m^2).  Estimated Nutritional Needs: Kcal: 1700-1850 (40-45  kcal/kg) Protein: 71-83 (1.7-2 g/kg) Fluid: 1.7-1.9 liters  Skin: WDL  Diet Order: Diet NPO time specified  EDUCATION NEEDS: -Education not appropriate at this time   Intake/Output Summary (Last 24 hours) at 06/27/14 1151 Last data filed at 06/26/14 1900  Gross per 24 hour  Intake      0 ml  Output     13 ml  Net    -13 ml    Last BM: Unknown  Labs:   Recent Labs Lab 06/26/14 1750 06/27/14 0657  NA 148* 149*  K 3.5 3.5  CL 111 116*  CO2 27 27  BUN 27* 18  CREATININE 0.96 0.83  CALCIUM 9.2 8.7  MG 2.1  --   GLUCOSE 139* 111*    CBG (last 3)   Recent Labs  06/27/14 0009 06/27/14 0700 06/27/14 0723  GLUCAP 104* 90 88    Scheduled Meds: . levothyroxine  87.5 mcg Intravenous Daily    Continuous Infusions: . sodium chloride 75 mL/hr at 06/27/14 1103  . dextrose 5 % and 0.9% NaCl Stopped (06/27/14 1103)    Past Medical History  Diagnosis Date  . Hypertension   . Chronic kidney disease   . Thyroid disease     hypothyroidism  . Gastric reflux   . Alzheimer's dementia   . Arthritis   . Vitamin D deficiency   . Abnormal weight loss     Past Surgical History  Procedure  Laterality Date  . Abdominal hysterectomy      Dana Grimes RD, LDN Nutrition Pager: [phone] 06/27/2014 11:51 AM

## 2014-06-27 NOTE — Op Note (Signed)
EGD PROCEDURE REPORT  PATIENT:  Dana Grimes  MR#:  [id number] Birthdate:  01/13/1946, 69 y.o., female Endoscopist:  Dr. Malissa HippoNajeeb U. Rehman, MD  Procedure Date: 06/27/2014  Procedure:   EGD with PEG  Indications:  Patient is 69 year old African-American female with progressive dementia who was developed oropharyngeal dysphagia with continued weight loss which has accelerated over the last 2 weeks. She also has developed mild dehydration. Her family members have requested placement of PEG for feeding purposes.            Informed Consent:  The risks, benefits, alternatives & imponderables which include, but are not limited to, bleeding, infection, perforation, drug reaction and potential missed lesion have been reviewed.  The potential for biopsy, lesion removal, esophageal dilation, etc. have also been discussed.  Questions have been answered.  All parties agreeable.  Please see history & physical in medical record for more information. Informed consent was obtained from her daughter Mrs. Dana Grimes.  Medications:  Versed 2 mg IV Cetacaine spray topically for oropharyngeal anesthesia  Description of procedure:  The endoscope was introduced through the mouth and advanced to the second portion of the duodenum without difficulty or limitations. The mucosal surfaces were surveyed very carefully during advancement of the scope and upon withdrawal.  Findings:  Esophagus:  Mucosa of the esophagus was normal. GE junction was unremarkable. Stomach:  Stomach was empty and distended very well with insufflation. Folds in the proximal stomach were unremarkable. Mucosa at gastric body was normal. There was focal edema and erythema and erosions at antrum. No ulcers present. Pyloric channel was patent. And redness fundus and cardia were normal. Duodenum:  Normal bulbar and post bulbar mucosa.  Therapeutic/Diagnostic Maneuvers Performed:    Ms. Dana Celestinenitra Bell, RN assisted with skin part of the procedure.  Site was marked for placement of PEG where there was good transillumination and one to one movement on pushing with blunt and of needle cap. Site was prepped in the usual fashion with betadaine solution. 1% Xylocaine was injected in the skin and subcutaneous tissue for topical anesthesia Small skin deep incision was made. 14-gauge Seldinger needle was advanced to abdominal wall into gastric lumen and first attempt. Trocar was removed and guidewire was passed through the needle and call with snare already in place. Endoscope was withdrawn along with the guidewire. It was connected to the G-tube and wire was pulled until the other end of G-tube was felt to be resting Megace gastric mucosa. Endoscope was passed again to confirm the position. NG tube was in good position. Reading on the outside 3 cm. To was secured on the outside by placing a bolster around it. Was cut to desired length and connected to an adapter. G site was cleaned and covered with sterile dressing.  Complications:  None  Impression: Antral gastritis. 20 [place] [employer] gastrostomy tube placement into distal gastric body for feeding purposes.  Recommendations:  Use abdominal binder for 1 week in order to prevent patient from pulling the tube out. Normal saline at a rate of 50 mL per hour for 24 hours. Will begin gastric feeding tomorrow. Dietary consultation.  REHMAN,NAJEEB U  06/27/2014  1:07 PM  CC: Dr. No primary care provider on file. & Dr. No ref. provider found

## 2014-06-28 DIAGNOSIS — R634 Abnormal weight loss: Secondary | ICD-10-CM | POA: Diagnosis not present

## 2014-06-28 DIAGNOSIS — Z931 Gastrostomy status: Secondary | ICD-10-CM

## 2014-06-28 DIAGNOSIS — F0390 Unspecified dementia without behavioral disturbance: Secondary | ICD-10-CM | POA: Diagnosis not present

## 2014-06-28 DIAGNOSIS — E870 Hyperosmolality and hypernatremia: Secondary | ICD-10-CM | POA: Diagnosis not present

## 2014-06-28 DIAGNOSIS — R627 Adult failure to thrive: Secondary | ICD-10-CM | POA: Diagnosis not present

## 2014-06-28 DIAGNOSIS — R1312 Dysphagia, oropharyngeal phase: Secondary | ICD-10-CM | POA: Diagnosis not present

## 2014-06-28 DIAGNOSIS — E860 Dehydration: Secondary | ICD-10-CM | POA: Diagnosis not present

## 2014-06-28 LAB — CBC
HEMATOCRIT: 32.5 % — AB (ref 36.0–46.0)
Hemoglobin: 10.7 g/dL — ABNORMAL LOW (ref 12.0–15.0)
MCH: 26.2 pg (ref 26.0–34.0)
MCHC: 32.9 g/dL (ref 30.0–36.0)
MCV: 79.7 fL (ref 78.0–100.0)
Platelets: 180 10*3/uL (ref 150–400)
RBC: 4.08 MIL/uL (ref 3.87–5.11)
RDW: 12.3 % (ref 11.5–15.5)
WBC: 10.9 10*3/uL — ABNORMAL HIGH (ref 4.0–10.5)

## 2014-06-28 LAB — BASIC METABOLIC PANEL
Anion gap: 6 (ref 5–15)
BUN: 6 mg/dL (ref 6–23)
CALCIUM: 8.3 mg/dL — AB (ref 8.4–10.5)
CO2: 27 mmol/L (ref 19–32)
CREATININE: 0.76 mg/dL (ref 0.50–1.10)
Chloride: 108 mmol/L (ref 96–112)
GFR, EST NON AFRICAN AMERICAN: 84 mL/min — AB (ref >90)
Glucose, Bld: 116 mg/dL — ABNORMAL HIGH (ref 70–99)
POTASSIUM: 2.8 mmol/L — AB (ref 3.5–5.1)
Sodium: 141 mmol/L (ref 135–145)

## 2014-06-28 LAB — GLUCOSE, CAPILLARY
GLUCOSE-CAPILLARY: 92 mg/dL (ref 70–99)
Glucose-Capillary: 106 mg/dL — ABNORMAL HIGH (ref 70–99)
Glucose-Capillary: 110 mg/dL — ABNORMAL HIGH (ref 70–99)

## 2014-06-28 LAB — MAGNESIUM: Magnesium: 1.5 mg/dL (ref 1.5–2.5)

## 2014-06-28 MED ORDER — MORPHINE SULFATE 2 MG/ML IJ SOLN
1.0000 mg | INTRAMUSCULAR | Status: DC | PRN
  Administered 2014-06-28 – 2014-07-01 (×5): 1 mg via INTRAVENOUS
  Filled 2014-06-28 (×5): qty 1

## 2014-06-28 MED ORDER — RISPERIDONE 1 MG PO TABS: 2.0000 mg | ORAL_TABLET | Freq: Every day | ORAL | Status: DC

## 2014-06-28 MED ORDER — POTASSIUM CHLORIDE 20 MEQ PO PACK: 40.0000 meq | PACK | ORAL | Status: DC

## 2014-06-28 MED ORDER — HYDROCODONE-ACETAMINOPHEN 5-325 MG PO TABS: 1.0000 | ORAL_TABLET | Freq: Four times a day (QID) | ORAL | Status: DC | PRN

## 2014-06-28 MED ORDER — KCL IN DEXTROSE-NACL 40-5-0.45 MEQ/L-%-% IV SOLN
INTRAVENOUS | Status: DC
  Administered 2014-06-28 – 2014-06-29 (×2): via INTRAVENOUS

## 2014-06-28 MED ORDER — POTASSIUM CHLORIDE 20 MEQ/15ML (10%) PO SOLN
40.0000 meq | ORAL | Status: AC
  Administered 2014-06-28 (×2): 40 meq
  Filled 2014-06-28 (×2): qty 30

## 2014-06-28 MED ORDER — RISPERIDONE 1 MG PO TABS
4.0000 mg | ORAL_TABLET | Freq: Every day | ORAL | Status: DC
Start: 2014-06-28 — End: 2014-06-28

## 2014-06-28 MED ORDER — POTASSIUM CHLORIDE 2 MEQ/ML IV SOLN: INTRAVENOUS | Status: DC

## 2014-06-28 MED ORDER — LEVOTHYROXINE SODIUM 75 MCG PO TABS
175.0000 ug | ORAL_TABLET | Freq: Every day | ORAL | Status: DC
  Administered 2014-06-29 – 2014-07-01 (×3): 175 ug
  Filled 2014-06-28 (×6): qty 1

## 2014-06-28 MED ORDER — OSMOLITE 1.2 CAL PO LIQD
20.0000 mL | ORAL | Status: DC
  Filled 2014-06-28: qty 237

## 2014-06-28 MED ORDER — HYDROCODONE-ACETAMINOPHEN 5-325 MG PO TABS
1.0000 | ORAL_TABLET | Freq: Four times a day (QID) | ORAL | Status: DC
  Administered 2014-06-28 – 2014-06-30 (×8): 1 via ORAL
  Filled 2014-06-28 (×8): qty 1

## 2014-06-28 MED ORDER — OSMOLITE 1.5 CAL PO LIQD
1000.0000 mL | ORAL | Status: DC
  Administered 2014-06-28: 1000 mL
  Filled 2014-06-28 (×3): qty 1000

## 2014-06-28 NOTE — Progress Notes (Signed)
  Subjective:  Patient is unable to provide any history.  Objective: Blood pressure 132/64, pulse 62, temperature 98.2 F (36.8 C), temperature source Axillary, resp. rate 16, height 5\' 7"  (1.702 m), weight 97 lb 14.2 oz (44.4 kg), SpO2 98 %. Patient appears to be comfortable.  She does not respond to vocal stimuli.  Abdomen is flat and soft with normal bowel sounds. Abdominal binder in place.   Labs/studies Results:   Recent Labs  06/26/14 1750 06/27/14 0657  WBC 6.1 6.6  HGB 11.3* 10.4*  HCT 35.5* 32.5*  PLT 241 203    BMET   Recent Labs  06/26/14 1750 06/27/14 0657  NA 148* 149*  K 3.5 3.5  CL 111 116*  CO2 27 27  GLUCOSE 139* 111*  BUN 27* 18  CREATININE 0.96 0.83  CALCIUM 9.2 8.7    LFT   Recent Labs  06/26/14 1750 06/27/14 0657  PROT 7.3 6.0  ALBUMIN 4.0 3.3*  AST 24 19  ALT 14 12  ALKPHOS 56 49  BILITOT 0.6 0.6     Assessment:  #1. Oropharyngeal dysphagia secondary to advanced dementia. Status post PEG placement yesterday. Abdominal exam is benign. #2. Anemia appears to be chronic. #3.  Mild  hypernatremia secondary to dehydration. #4.  Advanced dementia.  Recommendations;  Begin Osmolite gastric feeding as recommended by Christophe LouisNathan Franks RD. Social service consult for placement.

## 2014-06-28 NOTE — Plan of Care (Signed)
Problem: Phase I Progression Outcomes Goal: OOB as tolerated unless otherwise ordered Outcome: Not Progressing Attempted to assist patient out of bed onto stretcher 3/26 for procedure. Patient was unable to get up at that time. Will continue to evaluate during patient's stay.

## 2014-06-28 NOTE — Progress Notes (Signed)
[facility] PROGRESS NOTE  Shaquella Stamant ZOX:[id number] DOB: 06-10-1945 DOA: 06/26/2014 PCP: No primary care provider on file.  Assessment/Plan: 1. Failure to thrive with poor by mouth intake. Status post PEG tube placement by Gastroenterology. Will start on tube feeds 2. Dehydration. Continue IV fluids 3. Hypernatremia. Resolved with hypotonic fluids. 4. Severe malnutrition. Being seen by nutrition 5. Hypothyroidism, restart home dose synthroid 6. Advanced dementia. Restart home dose risperdal 7. Hypokalemia, replete, check magnesium  Code Status: full code Family Communication: no family present Disposition Plan: csw consult to determine most appropriate disposition   Consultants:  Gastroenterology, Dr. Karilyn Cota  Procedures:  PEG tube placement 3/26  Antibiotics:    HPI/Subjective: Patient does not open eyes to voice, speech is incoherent  Objective: Filed Vitals:   06/28/14 0533  BP: 132/64  Pulse: 62  Temp: 98.2 F (36.8 C)  Resp: 16    Intake/Output Summary (Last 24 hours) at 06/28/14 1041 Last data filed at 06/27/14 1900  Gross per 24 hour  Intake  337.5 ml  Output      0 ml  Net  337.5 ml   Filed Weights   06/26/14 1510 06/27/14 0454 06/28/14 0533  Weight: 49.896 kg (110 lb) 41.6 kg (91 lb 11.4 oz) 44.4 kg (97 lb 14.2 oz)    Exam:   General:  NAD, confused, appears to be in pain, agitated  Cardiovascular: s1, s2,rrr  Respiratory: cta b  Abdomen: soft, nt, bs+, PEG tube in place  Musculoskeletal: no edema b/l   Data Reviewed: Basic Metabolic Panel:  Recent Labs Lab 06/26/14 1750 06/27/14 0657  NA 148* 149*  K 3.5 3.5  CL 111 116*  CO2 27 27  GLUCOSE 139* 111*  BUN 27* 18  CREATININE 0.96 0.83  CALCIUM 9.2 8.7  MG 2.1  --    Liver Function Tests:  Recent Labs Lab 06/26/14 1750 06/27/14 0657  AST 24 19  ALT 14 12  ALKPHOS 56 49  BILITOT 0.6 0.6  PROT 7.3 6.0  ALBUMIN 4.0 3.3*   No results for input(s):  LIPASE, AMYLASE in the last 168 hours. No results for input(s): AMMONIA in the last 168 hours. CBC:  Recent Labs Lab 06/26/14 1750 06/27/14 0657  WBC 6.1 6.6  NEUTROABS 4.8 5.0  HGB 11.3* 10.4*  HCT 35.5* 32.5*  MCV 80.9 80.6  PLT 241 203   Cardiac Enzymes: No results for input(s): CKTOTAL, CKMB, CKMBINDEX, TROPONINI in the last 168 hours. BNP (last 3 results) No results for input(s): BNP in the last 8760 hours.  ProBNP (last 3 results) No results for input(s): PROBNP in the last 8760 hours.  CBG:  Recent Labs Lab 06/27/14 0700 06/27/14 0723 06/27/14 1146 06/28/14 0046 06/28/14 0528  GLUCAP 90 88 104* 92 106*    Recent Results (from the past 240 hour(s))  Surgical pcr screen     Status: None   Collection Time: 06/27/14  4:40 AM  Result Value Ref Range Status   MRSA, PCR NEGATIVE NEGATIVE Final   Staphylococcus aureus NEGATIVE NEGATIVE Final    Comment:        The Xpert SA Assay (FDA approved for NASAL specimens in patients over 31 years of age), is one component of a comprehensive surveillance program.  Test performance has been validated by [facility] for patients greater than or equal to 36 year old. It is not intended to diagnose infection nor to guide or monitor treatment.      Studies: No results found.  Scheduled Meds: . levothyroxine  87.5 mcg Intravenous Daily   Continuous Infusions: . dextrose 5 % and 0.45% NaCl 75 mL/hr at 06/28/14 0413  . feeding supplement (OSMOLITE 1.2 CAL)    . sodium chloride irrigation 50 mL (06/27/14 1801)    Principal Problem:   Failure to thrive in adult Active Problems:   Dehydration   Hypernatremia   Dementia   Hypothyroidism   Protein-calorie malnutrition, severe    Time spent: 25mins    [facility],JEHANZEB  [facility] Pager [phone]. If 7PM-7AM, please contact night-coverage at [email or web address], password [email or web address] 06/28/2014, 10:41 AM  LOS: 2 days

## 2014-06-29 ENCOUNTER — Encounter (HOSPITAL_COMMUNITY): Payer: Self-pay | Admitting: Internal Medicine

## 2014-06-29 DIAGNOSIS — E870 Hyperosmolality and hypernatremia: Secondary | ICD-10-CM | POA: Diagnosis present

## 2014-06-29 DIAGNOSIS — E43 Unspecified severe protein-calorie malnutrition: Secondary | ICD-10-CM | POA: Diagnosis present

## 2014-06-29 DIAGNOSIS — R1312 Dysphagia, oropharyngeal phase: Secondary | ICD-10-CM | POA: Diagnosis present

## 2014-06-29 DIAGNOSIS — Z90710 Acquired absence of both cervix and uterus: Secondary | ICD-10-CM | POA: Diagnosis not present

## 2014-06-29 DIAGNOSIS — K219 Gastro-esophageal reflux disease without esophagitis: Secondary | ICD-10-CM | POA: Diagnosis present

## 2014-06-29 DIAGNOSIS — E039 Hypothyroidism, unspecified: Secondary | ICD-10-CM | POA: Diagnosis present

## 2014-06-29 DIAGNOSIS — G309 Alzheimer's disease, unspecified: Secondary | ICD-10-CM | POA: Diagnosis present

## 2014-06-29 DIAGNOSIS — R627 Adult failure to thrive: Secondary | ICD-10-CM | POA: Diagnosis present

## 2014-06-29 DIAGNOSIS — E860 Dehydration: Secondary | ICD-10-CM | POA: Diagnosis present

## 2014-06-29 DIAGNOSIS — N182 Chronic kidney disease, stage 2 (mild): Secondary | ICD-10-CM | POA: Diagnosis present

## 2014-06-29 DIAGNOSIS — Z7982 Long term (current) use of aspirin: Secondary | ICD-10-CM | POA: Diagnosis not present

## 2014-06-29 DIAGNOSIS — M1990 Unspecified osteoarthritis, unspecified site: Secondary | ICD-10-CM | POA: Diagnosis present

## 2014-06-29 DIAGNOSIS — R634 Abnormal weight loss: Secondary | ICD-10-CM | POA: Diagnosis not present

## 2014-06-29 DIAGNOSIS — F419 Anxiety disorder, unspecified: Secondary | ICD-10-CM | POA: Diagnosis present

## 2014-06-29 DIAGNOSIS — I129 Hypertensive chronic kidney disease with stage 1 through stage 4 chronic kidney disease, or unspecified chronic kidney disease: Secondary | ICD-10-CM | POA: Diagnosis present

## 2014-06-29 DIAGNOSIS — Z931 Gastrostomy status: Secondary | ICD-10-CM | POA: Diagnosis not present

## 2014-06-29 DIAGNOSIS — R4702 Dysphasia: Secondary | ICD-10-CM | POA: Diagnosis present

## 2014-06-29 DIAGNOSIS — K2960 Other gastritis without bleeding: Secondary | ICD-10-CM | POA: Diagnosis present

## 2014-06-29 DIAGNOSIS — F0281 Dementia in other diseases classified elsewhere with behavioral disturbance: Secondary | ICD-10-CM | POA: Diagnosis present

## 2014-06-29 DIAGNOSIS — F0390 Unspecified dementia without behavioral disturbance: Secondary | ICD-10-CM | POA: Diagnosis not present

## 2014-06-29 DIAGNOSIS — K3184 Gastroparesis: Secondary | ICD-10-CM | POA: Diagnosis present

## 2014-06-29 DIAGNOSIS — E876 Hypokalemia: Secondary | ICD-10-CM | POA: Diagnosis present

## 2014-06-29 DIAGNOSIS — D649 Anemia, unspecified: Secondary | ICD-10-CM | POA: Diagnosis present

## 2014-06-29 DIAGNOSIS — Z681 Body mass index (BMI) 19 or less, adult: Secondary | ICD-10-CM | POA: Diagnosis not present

## 2014-06-29 DIAGNOSIS — K2950 Unspecified chronic gastritis without bleeding: Secondary | ICD-10-CM | POA: Diagnosis not present

## 2014-06-29 LAB — CBC
HEMATOCRIT: 29.5 % — AB (ref 36.0–46.0)
Hemoglobin: 9.7 g/dL — ABNORMAL LOW (ref 12.0–15.0)
MCH: 26.2 pg (ref 26.0–34.0)
MCHC: 32.9 g/dL (ref 30.0–36.0)
MCV: 79.7 fL (ref 78.0–100.0)
PLATELETS: 186 10*3/uL (ref 150–400)
RBC: 3.7 MIL/uL — ABNORMAL LOW (ref 3.87–5.11)
RDW: 12.4 % (ref 11.5–15.5)
WBC: 9.3 10*3/uL (ref 4.0–10.5)

## 2014-06-29 LAB — BASIC METABOLIC PANEL
ANION GAP: 4 — AB (ref 5–15)
BUN: 7 mg/dL (ref 6–23)
CALCIUM: 8 mg/dL — AB (ref 8.4–10.5)
CO2: 24 mmol/L (ref 19–32)
Chloride: 112 mmol/L (ref 96–112)
Creatinine, Ser: 0.78 mg/dL (ref 0.50–1.10)
GFR calc non Af Amer: 83 mL/min — ABNORMAL LOW (ref >90)
Glucose, Bld: 121 mg/dL — ABNORMAL HIGH (ref 70–99)
Potassium: 4 mmol/L (ref 3.5–5.1)
Sodium: 140 mmol/L (ref 135–145)

## 2014-06-29 LAB — GLUCOSE, CAPILLARY
Glucose-Capillary: 126 mg/dL — ABNORMAL HIGH (ref 70–99)
Glucose-Capillary: 147 mg/dL — ABNORMAL HIGH (ref 70–99)
Glucose-Capillary: 178 mg/dL — ABNORMAL HIGH (ref 70–99)

## 2014-06-29 MED ORDER — OSMOLITE 1.5 CAL PO LIQD
1000.0000 mL | ORAL | Status: DC
  Administered 2014-06-29 – 2014-06-30 (×2): 1000 mL
  Filled 2014-06-29 (×3): qty 1000

## 2014-06-29 MED ORDER — MAGNESIUM SULFATE 2 GM/50ML IV SOLN
2.0000 g | Freq: Once | INTRAVENOUS | Status: AC
  Administered 2014-06-29: 2 g via INTRAVENOUS
  Filled 2014-06-29: qty 50

## 2014-06-29 NOTE — Progress Notes (Signed)
Patient is tolerating gastric feeding. She is getting pain medication and is resting according to nursing staff. Patient is afebrile. She mumbles but does not respond to questions. Abdomen is flat with normal bowel sounds on palpation it soft and nontender. Gastrostomy tube is in place. No drainage noted from stoma. Bolster was pulled back by about 8 mm. He site was cleaned and redressed.

## 2014-06-29 NOTE — Progress Notes (Signed)
UR completed 

## 2014-06-29 NOTE — Care Management Note (Addendum)
    Page 1 of 1   07/02/2014     1:08:28 PM CARE MANAGEMENT NOTE 07/02/2014  Patient:  Dana Grimes,Dana Grimes   Account Number:  [id number]  Date Initiated:  06/29/2014  Documentation initiated by:  Dana Grimes,Dana Grimes  Subjective/Objective Assessment:   [facility]. Pt now requires higher level of care. CSW aware and  making arrangmeents for SNF placement. No Grimes needs.     Action/Plan:   Anticipated DC Date:  06/30/2014   Anticipated DC Plan:  SKILLED NURSING FACILITY  In-house referral  Clinical Social Worker      DC Planning Services  Grimes consult      Choice offered to / List presented to:             Status of service:  Completed, signed off Medicare Important Message given?  YES (If response is "NO", the following Medicare IM given date fields will be blank) Date Medicare IM given:  06/30/2014 Medicare IM given by:  Dana Grimes,Dana Grimes Date Additional Medicare IM given:   Additional Medicare IM given by:    Discharge Disposition:  SKILLED NURSING FACILITY  Per UR Regulation:  Reviewed for med. necessity/level of care/duration of stay  If discussed at Long Length of Stay Meetings, dates discussed:   07/02/2014    Comments:  07/02/14 1305 Dana Queenammy Dariyon Grimes, Dana Grimes Pt discharged to [facility]. CSW to arrange discharge to facility.  06/30/14 1520 Dana Queenammy Palmira Grimes, Dana Grimes Pt discharged cancelled today due to pt having more abd pain and issues with tube feedings. Hopeful for discharge on 07/01/14.  06/30/14 1050 Dana Queenammy Georgana Grimes, Dana Grimes Pt [facility]. CSW to arrange discharge to facility.  06/09/2014 1500 Dana SheriffJessica Childress, RN, MSN, Grimes

## 2014-06-29 NOTE — Progress Notes (Signed)
[facility] PROGRESS NOTE  Dana Grimes ZOX:[id number] DOB: Jan 20, 1946 DOA: 06/26/2014 PCP: No primary care provider on file.  Assessment/Plan: 1. Failure to thrive with poor by mouth intake. Status post PEG tube placement by Gastroenterology. Started on tube feeds 2. Dehydration. Improved with IV fluids 3. Hypernatremia. Resolved with hypotonic fluids. 4. Severe malnutrition. Being seen by nutrition 5. Hypothyroidism, continue home dose synthroid 6. Advanced dementia. Was previously on risperdal for agitation, but daughter reports that she did not tolerate this very well. Continue ativan prn for agitation 7. Hypokalemia, replete, check magnesium  Code Status: full code Family Communication: discussed with daughter Dana Grimes at the bedside Disposition Plan: csw consult to determine most appropriate disposition   Consultants:  Gastroenterology, Dr. Karilyn Cota  Procedures:  PEG tube placement 3/26  Antibiotics:    HPI/Subjective: Patient does not open eyes to voice, sleeping  Objective: Filed Vitals:   06/29/14 1421  BP: 106/62  Pulse: 63  Temp: 97.7 F (36.5 C)  Resp: 20    Intake/Output Summary (Last 24 hours) at 06/29/14 1553 Last data filed at 06/29/14 0600  Gross per 24 hour  Intake   2263 ml  Output      0 ml  Net   2263 ml   Filed Weights   06/26/14 1510 06/27/14 0454 06/28/14 0533  Weight: 49.896 kg (110 lb) 41.6 kg (91 lb 11.4 oz) 44.4 kg (97 lb 14.2 oz)    Exam:   General:  NAD, sleeping  Cardiovascular: s1, s2,rrr  Respiratory: cta b  Abdomen: soft, nt, bs+, PEG tube in place  Musculoskeletal: no edema b/l   Data Reviewed: Basic Metabolic Panel:  Recent Labs Lab 06/26/14 1750 06/27/14 0657 06/28/14 1119 06/29/14 0633  NA 148* 149* 141 140  K 3.5 3.5 2.8* 4.0  CL 111 116* 108 112  CO2 GLUCOSE 139* 111* 116* 121*  BUN 27* CREATININE 0.96 0.83 0.76 0.78  CALCIUM 9.2 8.7 8.3* 8.0*  MG 2.1  --  1.5  --     Liver Function Tests:  Recent Labs Lab 06/26/14 1750 06/27/14 0657  AST 24 19  ALT 14 12  ALKPHOS 56 49  BILITOT 0.6 0.6  PROT 7.3 6.0  ALBUMIN 4.0 3.3*   No results for input(s): LIPASE, AMYLASE in the last 168 hours. No results for input(s): AMMONIA in the last 168 hours. CBC:  Recent Labs Lab 06/26/14 1750 06/27/14 0657 06/28/14 1119 06/29/14 0633  WBC 6.1 6.6 10.9* 9.3  NEUTROABS 4.8 5.0  --   --   HGB 11.3* 10.4* 10.7* 9.7*  HCT 35.5* 32.5* 32.5* 29.5*  MCV 80.9 80.6 79.7 79.7  PLT 241 203 180 186   Cardiac Enzymes: No results for input(s): CKTOTAL, CKMB, CKMBINDEX, TROPONINI in the last 168 hours. BNP (last 3 results) No results for input(s): BNP in the last 8760 hours.  ProBNP (last 3 results) No results for input(s): PROBNP in the last 8760 hours.  CBG:  Recent Labs Lab 06/28/14 0528 06/28/14 1154 06/29/14 0012 06/29/14 0606 06/29/14 1229  GLUCAP 106* 110* 126* 147* 178*    Recent Results (from the past 240 hour(s))  Surgical pcr screen     Status: None   Collection Time: 06/27/14  4:40 AM  Result Value Ref Range Status   MRSA, PCR NEGATIVE NEGATIVE Final   Staphylococcus aureus NEGATIVE NEGATIVE Final    Comment:        The Xpert SA Assay (FDA  approved for NASAL specimens in patients over 69 years of age), is one component of a comprehensive surveillance program.  Test performance has been validated by [facility] for patients greater than or equal to 69 year old. It is not intended to diagnose infection nor to guide or monitor treatment.      Studies: No results found.  Scheduled Meds: . HYDROcodone-acetaminophen  1 tablet Oral Q6H  . levothyroxine  175 mcg Per Tube QAC breakfast   Continuous Infusions: . dextrose 5 % and 0.45 % NaCl with KCl 40 mEq/L 75 mL/hr at 06/29/14 0343  . dextrose 5 % and 0.45% NaCl 75 mL/hr at 06/28/14 0413  . feeding supplement (OSMOLITE 1.5 CAL)    . sodium chloride irrigation 50 mL (06/27/14  1801)    Principal Problem:   Failure to thrive in adult Active Problems:   Dehydration   Hypernatremia   Dementia   Hypothyroidism   Protein-calorie malnutrition, severe    Time spent: 25mins    [facility],Dana Grimes  [facility] Pager [phone]. If 7PM-7AM, please contact night-coverage at [email or web address], password [email or web address] 06/29/2014, 3:53 PM  LOS: 3 days

## 2014-06-29 NOTE — Clinical Social Work Psychosocial (Signed)
Clinical Social Work Department BRIEF PSYCHOSOCIAL ASSESSMENT 06/29/2014  Patient:  Dana Grimes,Dana Grimes     Account Number:  [id number]     Admit date:  06/26/2014  Clinical Social Worker:  Nancie NeasSTULTZ,Jjesus Dingley, LCSW  Date/Time:  06/29/2014 01:20 PM  Referred by:  Physician  Date Referred:  06/29/2014 Referred for  SNF Placement   Other Referral:   Interview type:  Family Other interview type:   Dana Grimes- daughter    PSYCHOSOCIAL DATA Living Status:  FACILITY Admitted from facility:  [facility] Level of care:  Assisted Living Primary support name:  Dana Grimes Primary support relationship to patient:  CHILD, ADULT Degree of support available:   supportive    CURRENT CONCERNS Current Concerns  Post-Acute Placement   Other Concerns:    SOCIAL WORK ASSESSMENT / PLAN CSW spoke to pt's daughter/HCPOA Robin on phone. Pt is non-verbal. Dana Grimes reports that pt was at home with three children alternating her care to be with her around the clock. Last week, [facility] as family was unable to continue to meet her needs at home. Dana Grimes indicates that pt was diagnosed to Alzheimer's dementia at least 6-7 years ago, and have noticed a significant decline recently. [facility], pt refused to eat and get up and this concerned family. Pt had PEG tube placed this weekend. [facility] is unable to take pt back with PEG tube. CSW discussed higher level of care with family. They understand and were anticipating need for nursing facility. Aware that pt will be Medicaid only as she will not have qualifying 3 day stay. [place] and [place] so [place] more easily visit. Per MD, based on the fact that pt is currently requiring total care, pt should not require memory care unit. This was also discussed with family and they agree.   Assessment/plan status:  Psychosocial Support/Ongoing  Assessment of Needs Other assessment/ plan:   Information/referral to community resources:   SNF list    PATIENT'S/FAMILY'S RESPONSE TO PLAN OF CARE: Pt unable to participate in plan of care. Family report the last few weeks have been "rough" and they are "taking it as it comes." CSW provided support and acknowledged the amount of change they are experiencing.       Derenda FennelKara Liron Eissler, KentuckyLCSW [phone]

## 2014-06-29 NOTE — Clinical Social Work Placement (Signed)
Clinical Social Work Department CLINICAL SOCIAL WORK PLACEMENT NOTE 06/29/2014  Patient:  Dana RummageYELLOCK,[name]  Account Number:  [id number] Admit date:  06/26/2014  Clinical Social Worker:  Derenda FennelKARA Jonuel Butterfield, LCSW  Date/time:  06/29/2014 01:18 PM  Clinical Social Work is seeking post-discharge placement for this patient at the following level of care:   SKILLED NURSING   (*CSW will update this form in Epic as items are completed)   06/29/2014  Patient/family provided with [facility] Department of Clinical Social Work's list of facilities offering this level of care within the geographic area requested by the patient (or if unable, by the patient's family).  06/29/2014  Patient/family informed of their freedom to choose among providers that offer the needed level of care, that participate in Medicare, Medicaid or managed care program needed by the patient, have an available bed and are willing to accept the patient.  06/29/2014  Patient/family informed of [facility]' ownership interest in [facility], as well as of the fact that they are under no obligation to receive care at this facility.  PASARR submitted to EDS on 06/29/2014 PASARR number received on 06/29/2014  FL2 transmitted to all facilities in geographic area requested by pt/family on  06/29/2014 FL2 transmitted to all facilities within larger geographic area on   Patient informed that his/her managed care company has contracts with or will negotiate with  certain facilities, including the following:     Patient/family informed of bed offers received:   Patient chooses bed at  Physician recommends and patient chooses bed at    Patient to be transferred to  on   Patient to be transferred to facility by  Patient and family notified of transfer on  Name of family member notified:    The following physician request were entered in Epic:   Additional Comments:  Derenda FennelKara Tasman Zapata, LCSW [phone]

## 2014-06-29 NOTE — Progress Notes (Signed)
Nutrition Follow-up  INTERVENTION: Enteral feeding initiated and is at goal rate.Osmolite 1.5 @ 50 ml/hr.  Tube feeding regimen provides 1800 kcal (100% of needs), 75 grams of protein, and 968 ml of H2O.   NUTRITION DIAGNOSIS: Undernutrition related to dementia and Poor PO intake as evidenced by BMI of 14.4.   Goal: Pt to meet >/= 90% of their estimated nutrition needs   Monitor:  Labs,  TF tolerance, weight changes and skin assessments  Reason for Assessment: MST 3  69 y.o. female  Admitting Dx: Failure to thrive in adult  ASSESSMENT: 69 y.o. female with history of advanced dementia, hypothyroidism was brought to the ER after patient has been having poor oral intake over the last 2 weeks.   PEG placed (20 fr.) on 3/26. Tube feeding started and has successfully advanced to goal. No residuals noted.   Labs: Mag-WNL   Height: Ht Readings from Last 1 Encounters:  06/26/14 5\' 7"  (1.702 m)    Weight: Wt Readings from Last 1 Encounters:  06/28/14 97 lb 14.2 oz (44.4 kg)    Ideal Body Weight: 135 lbs  % Ideal Body Weight: 73%  Wt Readings from Last 10 Encounters:  06/28/14 97 lb 14.2 oz (44.4 kg)    Usual Body Weight: Unknown  BMI:  Body mass index is 15.33 kg/(m^2). underweight  Estimated Nutritional Needs: Kcal: 1700-1850 (40-45 kcal/kg) Protein: 71-83 (1.7-2 g/kg) Fluid: 1.7-1.9 liters  Skin: WDL  Diet Order: Diet NPO time specified  EDUCATION NEEDS: -Education not appropriate at this time   Intake/Output Summary (Last 24 hours) at 06/29/14 0840 Last data filed at 06/29/14 0600  Gross per 24 hour  Intake   2263 ml  Output      0 ml  Net   2263 ml    Last BM: Unknown  Labs:   Recent Labs Lab 06/26/14 1750 06/27/14 0657 06/28/14 1119 06/29/14 0633  NA 148* 149* 141 140  K 3.5 3.5 2.8* 4.0  CL 111 116* 108 112  CO2 27 27 27 24   BUN 27* 18 6 7   CREATININE 0.96 0.83 0.76 0.78  CALCIUM 9.2 8.7 8.3* 8.0*  MG 2.1  --  1.5  --   GLUCOSE  139* 111* 116* 121*    CBG (last 3)   Recent Labs  06/28/14 1154 06/29/14 0012 06/29/14 0606  GLUCAP 110* 126* 147*    Scheduled Meds: . HYDROcodone-acetaminophen  1 tablet Oral Q6H  . levothyroxine  175 mcg Per Tube QAC breakfast    Continuous Infusions: . dextrose 5 % and 0.45 % NaCl with KCl 40 mEq/L 75 mL/hr at 06/29/14 0343  . dextrose 5 % and 0.45% NaCl 75 mL/hr at 06/28/14 0413  . feeding supplement (OSMOLITE 1.5 CAL) 1,000 mL (06/29/14 0400)  . sodium chloride irrigation 50 mL (06/27/14 1801)    Past Medical History  Diagnosis Date  . Hypertension   . Chronic kidney disease   . Thyroid disease     hypothyroidism  . Gastric reflux   . Alzheimer's dementia   . Arthritis   . Vitamin D deficiency   . Abnormal weight loss     Past Surgical History  Procedure Laterality Date  . Abdominal hysterectomy      Royann ShiversLynn Omie Ferger MS,RD,CSG,LDN Office: #[phone] Pager: [phone]

## 2014-06-30 ENCOUNTER — Inpatient Hospital Stay (HOSPITAL_COMMUNITY): Payer: Medicare Other

## 2014-06-30 LAB — GLUCOSE, CAPILLARY
GLUCOSE-CAPILLARY: 75 mg/dL (ref 70–99)
Glucose-Capillary: 103 mg/dL — ABNORMAL HIGH (ref 70–99)
Glucose-Capillary: 129 mg/dL — ABNORMAL HIGH (ref 70–99)
Glucose-Capillary: 97 mg/dL (ref 70–99)

## 2014-06-30 MED ORDER — LORAZEPAM 2 MG/ML IJ SOLN
0.2500 mg | INTRAMUSCULAR | Status: DC | PRN
  Administered 2014-06-30 – 2014-07-01 (×4): 0.25 mg via INTRAVENOUS
  Filled 2014-06-30 (×4): qty 1

## 2014-06-30 MED ORDER — HYDROCODONE-ACETAMINOPHEN 5-325 MG PO TABS
1.0000 | ORAL_TABLET | Freq: Four times a day (QID) | ORAL | Status: DC | PRN
  Administered 2014-07-01 – 2014-07-02 (×2): 1 via ORAL
  Filled 2014-06-30 (×2): qty 1

## 2014-06-30 MED ORDER — DEXTROSE-NACL 5-0.45 % IV SOLN
INTRAVENOUS | Status: DC
  Administered 2014-06-30 – 2014-07-01 (×2): via INTRAVENOUS

## 2014-06-30 MED ORDER — DEXTROSE 5 % IV SOLN
1.0000 g | Freq: Once | INTRAVENOUS | Status: AC
  Administered 2014-06-30: 1 g via INTRAVENOUS
  Filled 2014-06-30: qty 10

## 2014-06-30 NOTE — Clinical Social Work Note (Signed)
Pt's daughter Zella BallRobin accepts bed at [facility]. Facility notified and will complete Medicaid prior approval. Awaiting stability for d/c.   Derenda FennelKara Sakeenah Valcarcel, LCSW [phone]

## 2014-06-30 NOTE — Progress Notes (Signed)
Restless, trying to pull off hand mitts.  Ativan given.

## 2014-06-30 NOTE — Progress Notes (Signed)
[facility] PROGRESS NOTE  Dana Grimes ZOX:[id number] DOB: 1945-06-06 DOA: 06/26/2014 PCP: No primary care provider on file.  Summary:  This is a 69 year old female with a history of advanced dementia, who per family was diagnosed approximately 9 years ago. She is a resident of an assisted living facility. She had decreased by mouth intake for several weeks resulting in significant weight loss and failure to thrive. She was brought to the hospital with dehydration and to be considered for a PEG tube placement. Patient was hydrated with IV fluids with improvement of her volume status. She was seen by gastroenterology and underwent PEG tube placement. Postprocedure, she appears to be having significant abdominal discomfort. This may be related to increased gastric residuals. Dr. Karilyn Cota is following the patient and has decreased rate of tube feeds as well as checking PEG tube placement. If her symptoms persist, may need to consider CT abdomen. If she starts to improve, then anticipate discharge in the next 1-2 days to skilled nursing facility.  Assessment/Plan: 1. Failure to thrive with poor by mouth intake. Status post PEG tube placement by Gastroenterology. Started on tube feeds 2. Abdominal discomfort. Patient appears to be having abdominal discomfort, especially on palpation. Clinically, her abdomen feels soft. Staff reports that she's been having increased gastric residual volumes. This possibly could be related to gastroparesis. Her infusion rate will  be decreased to 25 mL's per hour. Dr. Karilyn Cota is seeing the patient and will plan on Gastrografin study today. If workup is unrevealing and patient continues to have significant discomfort, then may need to consider CT abdomen. 3. Dehydration. Improved with IV fluids 4. Hypernatremia. Resolved with hypotonic fluids. 5. Severe malnutrition. Being seen by nutrition, on tube feeds 6. Hypothyroidism, continue home dose synthroid 7. Advanced  dementia. Was previously on risperdal for agitation, but daughter reports that she did not tolerate this very well. Continue ativan prn for agitation 8. Hypokalemia, replete, check magnesium  Code Status: full code Family Communication: discussed with daughter Dana Grimes at the bedside Disposition Plan: csw consult to determine most appropriate disposition   Consultants:  Gastroenterology, Dr. Karilyn Cota  Procedures:  PEG tube placement 3/26  Antibiotics:    HPI/Subjective: Patient does not open eyes to voice, sleeping  Objective: Filed Vitals:   06/30/14 1502  BP: 92/61  Pulse: 100  Temp: 98.7 F (37.1 C)  Resp: 20   No intake or output data in the 24 hours ending 06/30/14 1717 Filed Weights   06/26/14 1510 06/27/14 0454 06/28/14 0533  Weight: 49.896 kg (110 lb) 41.6 kg (91 lb 11.4 oz) 44.4 kg (97 lb 14.2 oz)    Exam:   General:  NAD, sleeping  Cardiovascular: s1, s2,rrr  Respiratory: cta b  Abdomen: soft, nt, bs+, PEG tube in place  Musculoskeletal: no edema b/l   Data Reviewed: Basic Metabolic Panel:  Recent Labs Lab 06/26/14 1750 06/27/14 0657 06/28/14 1119 06/29/14 0633  NA 148* 149* 141 140  K 3.5 3.5 2.8* 4.0  CL 111 116* 108 112  CO2 GLUCOSE 139* 111* 116* 121*  BUN 27* CREATININE 0.96 0.83 8.11 0.78  CALCIUM 9.2 8.7 8.3* 8.0*  MG 2.1  --  1.5  --    Liver Function Tests:  Recent Labs Lab 06/26/14 1750 06/27/14 0657  AST 24 19  ALT 14 12  ALKPHOS 56 49  BILITOT 0.6 0.6  PROT 7.3 6.0  ALBUMIN 4.0 3.3*   No results for input(s):  LIPASE, AMYLASE in the last 168 hours. No results for input(s): AMMONIA in the last 168 hours. CBC:  Recent Labs Lab 06/26/14 1750 06/27/14 0657 06/28/14 1119 06/29/14 0633  WBC 6.1 6.6 10.9* 9.3  NEUTROABS 4.8 5.0  --   --   HGB 11.3* 10.4* 10.7* 9.7*  HCT 35.5* 32.5* 32.5* 29.5*  MCV 80.9 80.6 79.7 79.7  PLT 241 203 180 186   Cardiac Enzymes: No results for input(s):  CKTOTAL, CKMB, CKMBINDEX, TROPONINI in the last 168 hours. BNP (last 3 results) No results for input(s): BNP in the last 8760 hours.  ProBNP (last 3 results) No results for input(s): PROBNP in the last 8760 hours.  CBG:  Recent Labs Lab 06/29/14 0606 06/29/14 1229 06/30/14 0037 06/30/14 0719 06/30/14 1119  GLUCAP 147* 178* 103* 75 129*    Recent Results (from the past 240 hour(s))  Surgical pcr screen     Status: None   Collection Time: 06/27/14  4:40 AM  Result Value Ref Range Status   MRSA, PCR NEGATIVE NEGATIVE Final   Staphylococcus aureus NEGATIVE NEGATIVE Final    Comment:        The Xpert SA Assay (FDA approved for NASAL specimens in patients over 69 years of age), is one component of a comprehensive surveillance program.  Test performance has been validated by [facility] for patients greater than or equal to 69 year old. It is not intended to diagnose infection nor to guide or monitor treatment.      Studies: No results found.  Scheduled Meds: . cefTRIAXone (ROCEPHIN)  IV  1 g Intravenous Once  . HYDROcodone-acetaminophen  1 tablet Oral Q6H  . levothyroxine  175 mcg Per Tube QAC breakfast   Continuous Infusions: . feeding supplement (OSMOLITE 1.5 CAL) 1,000 mL (06/30/14 1436)  . sodium chloride irrigation 50 mL (06/27/14 1801)    Principal Problem:   Failure to thrive in adult Active Problems:   Dehydration   Hypernatremia   Dementia   Hypothyroidism   Protein-calorie malnutrition, severe    Time spent: 25mins    [facility] Hospital[facility],Manraj Yeo  [facility] Pager [phone]. If 7PM-7AM, please contact night-coverage at [email or web address], password [email or web address] 06/30/2014, 5:17 PM  LOS: 4 days

## 2014-06-30 NOTE — Clinical Social Work Placement (Signed)
Clinical Social Work Department CLINICAL SOCIAL WORK PLACEMENT NOTE 06/30/2014  Patient:  Dana Grimes,Dana Grimes  Account Number:  [id number] Admit date:  06/26/2014  Clinical Social Worker:  Derenda FennelKARA Shalamar Plourde, LCSW  Date/time:  06/29/2014 01:18 PM  Clinical Social Work is seeking post-discharge placement for this patient at the following level of care:   SKILLED NURSING   (*CSW will update this form in Epic as items are completed)   06/29/2014  Patient/family provided with [facility] Department of Clinical Social Work's list of facilities offering this level of care within the geographic area requested by the patient (or if unable, by the patient's family).  06/29/2014  Patient/family informed of their freedom to choose among providers that offer the needed level of care, that participate in Medicare, Medicaid or managed care program needed by the patient, have an available bed and are willing to accept the patient.  06/29/2014  Patient/family informed of [facility]' ownership interest in [facility], as well as of the fact that they are under no obligation to receive care at this facility.  PASARR submitted to EDS on 06/29/2014 PASARR number received on 06/29/2014  FL2 transmitted to all facilities in geographic area requested by pt/family on  06/29/2014 FL2 transmitted to all facilities within larger geographic area on   Patient informed that his/her managed care company has contracts with or will negotiate with  certain facilities, including the following:     Patient/family informed of bed offers received:  06/30/2014 [facility] Physician recommends and patient chooses bed at    Patient to be transferred to  on   Patient to be transferred to facility by  Patient and family notified of transfer on  Name of family member notified:    The following physician request were entered in Epic:   Additional Comments:  Derenda FennelKara Bowman Higbie,  LCSW [phone]

## 2014-06-30 NOTE — Progress Notes (Signed)
  Subjective:  Patient is not able to provide any history. Patient's daughter Zella BallRobin states that she has had periods of restlessness at home but she was usually calm down easily. Patient had 200 mL of gastric sig residual earlier today.  Objective: Blood pressure 92/61, pulse 100, temperature 98.7 F (37.1 C), temperature source Axillary, resp. rate 20, height 5\' 7"  (1.702 m), weight 97 lb 14.2 oz (44.4 kg), SpO2 100 %. Patient is restless. She is trying to pull mittens away.. Abdomen is flat. Bowel sounds are normal. Abdomen is soft. Gastrostomy tube is in place. This scan mod of mucopurulent discharge from the stoma. Swallow was taken for culture. Gastric residual 8 mL. Extremities thin.  Labs/studies Results:   Recent Labs  06/28/14 1119 06/29/14 0633  WBC 10.9* 9.3  HGB 10.7* 9.7*  HCT 32.5* 29.5*  PLT 180 186    BMET   Recent Labs  06/28/14 1119 06/29/14 0633  NA 141 140  K 2.8* 4.0  CL 108 112  CO2 27 24  GLUCOSE 116* 121*  BUN 6 7  CREATININE 0.76 0.78  CALCIUM 8.3* 8.0*       Assessment:  #1. Large gastric residual. G-tube appears to be in good position. Large residual may be secondary to gastroparesis due to narcotic therapy. #2. Scant mucopurulent at gastrostomy tube site. Exam is negative for an abscess. #3. Restless and agitation possibly due to underlying dementia. Cannot rule out other etiologies.  Recommendations;  Portable KUB after injecting 60 ML of Gastrografin via G-tube. Ceftriaxone 1 g IV 1. Hold gastric feeding for now.

## 2014-07-01 DIAGNOSIS — E43 Unspecified severe protein-calorie malnutrition: Secondary | ICD-10-CM

## 2014-07-01 DIAGNOSIS — E860 Dehydration: Secondary | ICD-10-CM

## 2014-07-01 LAB — GLUCOSE, CAPILLARY
GLUCOSE-CAPILLARY: 101 mg/dL — AB (ref 70–99)
Glucose-Capillary: 115 mg/dL — ABNORMAL HIGH (ref 70–99)
Glucose-Capillary: 87 mg/dL (ref 70–99)
Glucose-Capillary: 87 mg/dL (ref 70–99)

## 2014-07-01 LAB — BASIC METABOLIC PANEL
Anion gap: 7 (ref 5–15)
BUN: 9 mg/dL (ref 6–23)
CO2: 31 mmol/L (ref 19–32)
Calcium: 9.4 mg/dL (ref 8.4–10.5)
Chloride: 103 mmol/L (ref 96–112)
Creatinine, Ser: 0.71 mg/dL (ref 0.50–1.10)
GFR calc Af Amer: >90 mL/min (ref >90)
GFR, EST NON AFRICAN AMERICAN: 86 mL/min — AB (ref >90)
Glucose, Bld: 99 mg/dL (ref 70–99)
Potassium: 4.8 mmol/L (ref 3.5–5.1)
SODIUM: 141 mmol/L (ref 135–145)

## 2014-07-01 LAB — CBC
HCT: 36.3 % (ref 36.0–46.0)
HEMOGLOBIN: 11.8 g/dL — AB (ref 12.0–15.0)
MCH: 25.8 pg — AB (ref 26.0–34.0)
MCHC: 32.5 g/dL (ref 30.0–36.0)
MCV: 79.4 fL (ref 78.0–100.0)
PLATELETS: 293 10*3/uL (ref 150–400)
RBC: 4.57 MIL/uL (ref 3.87–5.11)
RDW: 12.3 % (ref 11.5–15.5)
WBC: 11.2 10*3/uL — ABNORMAL HIGH (ref 4.0–10.5)

## 2014-07-01 MED ORDER — LEVOTHYROXINE SODIUM 75 MCG PO TABS: 150.0000 ug | ORAL_TABLET | Freq: Every day | ORAL | Status: DC

## 2014-07-01 MED ORDER — OSMOLITE 1.5 CAL PO LIQD
1000.0000 mL | ORAL | Status: DC
  Administered 2014-07-02: 1000 mL
  Filled 2014-07-01 (×2): qty 1000

## 2014-07-01 MED ORDER — LORAZEPAM 2 MG/ML IJ SOLN
0.5000 mg | INTRAMUSCULAR | Status: DC | PRN
  Administered 2014-07-01 – 2014-07-02 (×5): 0.5 mg via INTRAVENOUS
  Filled 2014-07-01 (×5): qty 1

## 2014-07-01 MED ORDER — HYDROMORPHONE HCL 1 MG/ML IJ SOLN: 2.0000 mg | INTRAMUSCULAR | Status: DC | PRN

## 2014-07-01 MED ORDER — OSMOLITE 1.5 CAL PO LIQD
1000.0000 mL | ORAL | Status: DC
  Filled 2014-07-01: qty 1000

## 2014-07-01 MED ORDER — OSMOLITE 1.5 CAL PO LIQD
1000.0000 mL | ORAL | Status: DC
  Administered 2014-07-01: 1000 mL
  Filled 2014-07-01 (×2): qty 1000

## 2014-07-01 MED ORDER — LEVOTHYROXINE SODIUM 75 MCG PO TABS
150.0000 ug | ORAL_TABLET | Freq: Every day | ORAL | Status: DC
  Administered 2014-07-02: 150 ug via ORAL
  Filled 2014-07-01: qty 2

## 2014-07-01 NOTE — Progress Notes (Signed)
Patients tube feed came disconnected at approximately 1230 with patient movement.  Residual amount of <5 noted.  Patient still having periods of agitation and restlessness, PRN ativan given again and patient helped OOB to chair.  Will continue to monitor.  Family at bedside as well as Technical brewerfloating safety sitter.

## 2014-07-01 NOTE — Progress Notes (Signed)
No residual noted from PEG tube this morning prior to med administration via tube.  Feeding rate increased to 35 per telephone order of Dr Karilyn Cotaehman

## 2014-07-01 NOTE — Progress Notes (Signed)
PROGRESS NOTE  Dana Grimes [id number]:[id number] DOB: 07/22/1945 DOA: 06/26/2014 PCP: No primary care provider on file.  Summary: 69 year old woman with advanced Alzheimer's dementia presented with failure to thrive, poor oral intake and was admitted for GI consultation and PEG tube placement after goals of care discussion between EDP and daughter. PEG tube was placed 3/26. She was unable to return to her living facility with a PEG tube and therefore hospitalization was prolonged until another facility could be found. Prior to discharge she developed significant abdominal pain and gastric residuals.  Assessment/Plan: 1. End-stage dementia with oropharyngeal dysphasia, resultant failure to thrive and poor oral intake. Status post PEG tube placement 3/26. Nonverbal at baseline. Ativan as needed. Daughter does not want the patient to take Risperdal. 2. Large gastric residual, abdominal pain. 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 Tolerating tube feeds today at lower dose. Difficult to assess pain. AXR without acute features or evidence of leak. 3. FTT, dehydration, hypernatremia. Resolved with IV fluids. 4. Normocytic anemia, stable. 5. 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 Hypothyroidism. TSH 0.013. Decrease dose of Synthroid and recheck in 4-6 weeks. 7. Severe malnutrition   Appears stable, advance tube feeds per GI  Monitor for recurrent residuals or pain  Decrease Synthroid and follow-up TSH in 4-6 weeks  Discussed with daughter Dana Grimes at bedside  Anticipate transfer to [facility] 3/31 if tolerating tube feeds and cleared by GU.  Code Status: Full code DVT prophylaxis: SCDs Family Communication:  Disposition Plan: [facility]  Brendia Sacksaniel Haley Roza, MD  [facility]  Pager [phone] If 7PM-7AM, please contact night-coverage at [email or web address], password [email or web address] 07/01/2014, 11:18 AM  LOS: 5 days   Consultants:  Gastroenterology  Procedures:  3/26 EGD Impression: Antral  gastritis. 20 [place] gastrostomy tube placement into distal gastric body for feeding purposes.  Antibiotics:    HPI/Subjective: No residuals noted today. 2 feeds increased. Daughter Dana Grimes at bedside. Patient non-verbal.  Objective: Filed Vitals:   06/30/14 0659 06/30/14 1502 06/30/14 2127 07/01/14 0611  BP: 130/68 92/61 138/93 160/108  Pulse: 66 100 100 99  Temp: 98.5 F (36.9 C) 98.7 F (37.1 C) 98.3 F (36.8 C) 98.2 F (36.8 C)  TempSrc: Axillary Axillary Axillary Axillary  Resp: 20 20 20 20   Height:      Weight:      SpO2: 100% 100% 100% 94%    Intake/Output Summary (Last 24 hours) at 07/01/14 1118 Last data filed at 07/01/14 1000  Gross per 24 hour  Intake    922 ml  Output      0 ml  Net    922 ml     Filed Weights   06/26/14 1510 06/27/14 0454 06/28/14 0533  Weight: 49.896 kg (110 lb) 41.6 kg (91 lb 11.4 oz) 44.4 kg (97 lb 14.2 oz)    Exam:     Afebrile, vital signs are stable. No hypoxia. General: Appears calm and comfortable Cardiovascular: RRR, no m/r/g. No LE edema. Respiratory: CTA bilaterally, no w/r/r. Normal respiratory effort. Abdomen: binder in place Musculoskeletal: grossly normal tone BUE/BLE Psychiatric: grossly normal mood and affect, speech fluent and appropriate  Data Reviewed:  Capillary blood sugar stable  TSH 0.013   Basic metabolic panel unremarkable  Modest elevation of WBC 11.2 unclear significance  Hemoglobin stable 11.8  Pending data:  Wound culture   Abdominal x-ray 3/29: No extraluminal contrast demonstrated.  Scheduled Meds: . feeding supplement (OSMOLITE 1.5 CAL)  1,000 mL Per Tube Q24H  . levothyroxine  175 mcg Per Tube QAC  breakfast   Continuous Infusions: . dextrose 5 % and 0.45% NaCl 60 mL/hr at 06/30/14 1838  . sodium chloride irrigation 50 mL (06/27/14 1801)    Principal Problem:   Dementia Active Problems:   Dehydration   Failure to thrive in adult   Hypernatremia   Hypothyroidism    Protein-calorie malnutrition, severe   Time spent 25 minutes

## 2014-07-01 NOTE — Plan of Care (Signed)
Problem: Phase I Progression Outcomes Goal: OOB as tolerated unless otherwise ordered Outcome: Adequate for Discharge Patients mobility limited d/t dementia and unable to follow commands.  Patient frequently moves self in bed

## 2014-07-01 NOTE — Progress Notes (Signed)
Residual checked again while changing tube feeding out.  Residual off approximately 18cc noted.  Patient up in chair and very restless/active while checking.

## 2014-07-01 NOTE — Progress Notes (Signed)
  Objective: Blood pressure 160/108, pulse 99, temperature 98.2 F (36.8 C), temperature source Axillary, resp. rate 20, height 5\' 7"  (1.702 m), weight 97 lb 14.2 oz (44.4 kg), SpO2 94 %. Patient presently is resting comfortably. Conjunctiva is pink. Sclera is nonicteric Oropharyngeal mucosa is normal. No neck masses or thyromegaly noted. Cardiac exam with regular rhythm normal S1 and S2. No murmur or gallop noted. Lungs are clear to auscultation. Abdomen is flat and soft. Scant amount of exudate noted at gastrostomy tube site. Gastric residual is 18 mL or less.  No LE edema or clubbing noted.  Labs/studies Results:   Recent Labs  06/29/14 0633 07/01/14 0548  WBC 9.3 11.2*  HGB 9.7* 11.8*  HCT 29.5* 36.3  PLT 186 293    BMET   Recent Labs  06/29/14 0633 07/01/14 0548  NA 140 141  K 4.0 4.8  CL 112 103  CO2 24 31  GLUCOSE 121* 99  BUN 7 9  CREATININE 0.78 0.71  CALCIUM 8.0* 9.4    Culture from gastrostomy site negative at 24 hours.  Assessment:  #1. Patient is tolerating gastric feeding today. Rate was increased to 35 mL per hour. Suspect transient gastroparesis due to narcotics #2. Scant mucopurulent material at gastrostomy tube site. Cultures from yesterday are negative. She was given single dose of Rocephin yesterday. Since cultures are negative will hold off further antibiotic therapy. #3. Restless and agitation possibly due to underlying dementia. Patient remains with intermittent agitation and restlessness responding to lorazepam. #4. Mild anemia. Hemoglobin has gone up she does not appear to be dehydrated. Will repeat labs in a.m.  Recommendations;  Increase gastric feeding rate back to 50 mL per hour. CBC in a.m.

## 2014-07-01 NOTE — Progress Notes (Signed)
Nutrition Follow-up  INTERVENTION: Enteral feeding: Osmolite 1.5 @ 35 ml/hr.   Once IV-fluids are d/c: recommend free water flushes 300 ml TID  Recommend consider adding bowel regimen. No BM since admission.   NUTRITION DIAGNOSIS: Undernutrition related to dementia and Poor PO intake as evidenced by BMI of 14.4.   Goal: Pt to meet >/= 90% of their estimated nutrition needs   Monitor:  Labs,  TF tolerance, weight changes and skin assessments  Reason for Assessment: MST 3  69 y.o. female  Admitting Dx: Failure to thrive in adult  ASSESSMENT: 69 y.o. female with history of advanced dementia, hypothyroidism was brought to the ER after patient has been having poor oral intake over the last 2 weeks.   PEG placed (20 fr.) on 3/26. Tube feeding started.   Osmolite 1.5 @ 35 ml/hr provides:1260 kcal daily,  53 gr protein and 640 ml water which meets 74% energy, 75% protein needs daily.  Labs (BMP)-:WNL   Height: Ht Readings from Last 1 Encounters:  06/26/14  (1.702 m)    Weight: Wt Readings from Last 1 Encounters:  06/28/14 97 lb 14.2 oz (44.4 kg)    Ideal Body Weight: 135 lbs  % Ideal Body Weight: 73%  Wt Readings from Last 10 Encounters:  06/28/14 97 lb 14.2 oz (44.4 kg)    Usual Body Weight: Unknown  BMI:  Body mass index is 15.33 kg/(m^2). underweight  Estimated Nutritional Needs: Kcal: 1700-1850 (40-45 kcal/kg) Protein: 71-83 (1.7-2 g/kg) Fluid: 1.7-1.9 liters  Skin: WDL  Diet Order: Diet regular Room service appropriate?: Yes; Fluid consistency:: Thin Diet - low sodium heart healthy  EDUCATION NEEDS: -Education not appropriate at this time   Intake/Output Summary (Last 24 hours) at 07/01/14 1151 Last data filed at 07/01/14 1000  Gross per 24 hour  Intake    922 ml  Output      0 ml  Net    922 ml    Last BM: Unknown  Labs:   Recent Labs Lab 06/26/14 1750  06/28/14 1119 06/29/14 0633 07/01/14 0548  NA 148*  < > 141 140 141  K  3.5  < > 2.8* 4.0 4.8  CL 111  < > 108 112 103  CO2 27  < > BUN 27*  < > CREATININE 0.96  < > 0.76 0.78 0.71  CALCIUM 9.2  < > 8.3* 8.0* 9.4  MG 2.1  --  1.5  --   --   GLUCOSE 139*  < > 116* 121* 99  < > = values in this interval not displayed.  CBG (last 3)   Recent Labs  06/30/14 2356 07/01/14 0608 07/01/14 1125  GLUCAP 87 87 101*    Scheduled Meds: . feeding supplement (OSMOLITE 1.5 CAL)  1,000 mL Per Tube Q24H  . levothyroxine  175 mcg Per Tube QAC breakfast    Continuous Infusions: . dextrose 5 % and 0.45% NaCl 60 mL/hr at 07/01/14 1132  . sodium chloride irrigation 50 mL (06/27/14 1801)    Past Medical History  Diagnosis Date  . Hypertension   . Chronic kidney disease   . Thyroid disease     hypothyroidism  . Gastric reflux   . Alzheimer's dementia   . Arthritis   . Vitamin D deficiency   . Abnormal weight loss     Past Surgical History  Procedure Laterality Date  . Abdominal hysterectomy    . Peg placement N/A 06/27/2014  Procedure: PERCUTANEOUS ENDOSCOPIC GASTROSTOMY (PEG) PLACEMENT;  Surgeon: Malissa HippoNajeeb U Rehman, MD;  Location: [facility];  Service: Endoscopy;  Laterality: N/A;    Royann ShiversLynn Anessia Oakland MS,RD,CSG,LDN Office: [phone] Pager: [phone]

## 2014-07-02 LAB — CBC
HCT: 31.9 % — ABNORMAL LOW (ref 36.0–46.0)
Hemoglobin: 10.3 g/dL — ABNORMAL LOW (ref 12.0–15.0)
MCH: 25.5 pg — ABNORMAL LOW (ref 26.0–34.0)
MCHC: 32.3 g/dL (ref 30.0–36.0)
MCV: 79 fL (ref 78.0–100.0)
Platelets: 262 10*3/uL (ref 150–400)
RBC: 4.04 MIL/uL (ref 3.87–5.11)
RDW: 12.2 % (ref 11.5–15.5)
WBC: 5.6 10*3/uL (ref 4.0–10.5)

## 2014-07-02 LAB — GLUCOSE, CAPILLARY
GLUCOSE-CAPILLARY: 83 mg/dL (ref 70–99)
GLUCOSE-CAPILLARY: 73 mg/dL (ref 70–99)
Glucose-Capillary: 127 mg/dL — ABNORMAL HIGH (ref 70–99)

## 2014-07-02 MED ORDER — TRIPLE ANTIBIOTIC 3.5-400-5000 EX OINT: TOPICAL_OINTMENT | CUTANEOUS | Status: DC

## 2014-07-02 MED ORDER — LEVOTHYROXINE SODIUM 150 MCG PO TABS: 150.0000 ug | ORAL_TABLET | Freq: Every morning | ORAL | Status: DC

## 2014-07-02 MED ORDER — TRIPLE ANTIBIOTIC 3.5-400-5000 EX OINT
TOPICAL_OINTMENT | Freq: Two times a day (BID) | CUTANEOUS | Status: DC
  Administered 2014-07-02: 1 via TOPICAL
  Filled 2014-07-02: qty 1

## 2014-07-02 MED ORDER — OSMOLITE 1.5 CAL PO LIQD: 1000.0000 mL | ORAL | Status: DC

## 2014-07-02 MED ORDER — LORAZEPAM 0.5 MG PO TABS: 0.5000 mg | ORAL_TABLET | Freq: Every day | ORAL | Status: DC | PRN

## 2014-07-02 MED ORDER — LORAZEPAM 0.5 MG PO TABS: 0.5000 mg | ORAL_TABLET | Freq: Two times a day (BID) | ORAL | Status: DC

## 2014-07-02 NOTE — Progress Notes (Signed)
Pt agitated, restless, and trying to hit staff. Knees draw drawn up to chest and fists clenched. Anxiety and pain medication and medication was effective.

## 2014-07-02 NOTE — Clinical Social Work Note (Signed)
Pt d/c today to [facility]. Pt's daughter, Zella BallRobin and facility aware and agreeable. D/C summary and FL2 faxed. [facility]  Derenda FennelKara Dinh [place], KentuckyLCSW [phone]

## 2014-07-02 NOTE — Progress Notes (Signed)
PROGRESS NOTE  Dana RummageBetty Jean Grimes ZOX:[id number] DOB: 10/13/1945 DOA: 06/26/2014 PCP: Koren BoundMATRONE, ANDREW, NP  Summary: 69 year old woman with advanced Alzheimer's dementia presented with failure to thrive, poor oral intake and was admitted for GI consultation and PEG tube placement after goals of care discussion between EDP and daughter. PEG tube was placed 3/26. She was unable to return to her living facility with a PEG tube and therefore hospitalization was prolonged until another facility could be found. Prior to discharge she developed significant abdominal pain and gastric residuals.  Assessment/Plan: 1. End-stage dementia with anxiety, with oropharyngeal dysphasia, resultant failure to thrive and poor oral intake. Status post PEG tube placement 3/26. Nonverbal at baseline. Daughter does not want the patient to take Risperdal. 2. Large gastric residual, abdominal pain; resolved. 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 Tolerating tube feeds, clear for discharge per GI. AXR without acute features or evidence of leak. 3. FTT, dehydration, hypernatremia. Resolved with IV fluids. 4. Normocytic anemia, remains stable. 5. 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 Hypothyroidism. TSH 0.013. Decrease dose of Synthroid and recheck in 4-6 weeks. 7. Severe malnutrition   Overall stable, tolerating tube feeds.  Dementia stable, anxiety a long-standing issue for months, failed Seroquel and Risperdal in past. After discussion with daughter plan schedule low dose Ativan and follow.  Decrease Synthroid and follow-up TSH in 4-6 weeks  Apply Neosporin twice a day to gastrostomy site for 1 week.  Use abdominal binder for 1 more week.  Code Status: Full code DVT prophylaxis: SCDs Family Communication:  Disposition Plan: [facility]  Brendia Sacksaniel Goodrich, MD  [facility]  Pager [phone] If 7PM-7AM, please contact night-coverage at [email or web address], password [email or web address] 07/02/2014, 1:27 PM  LOS:  6 days   Consultants:  Gastroenterology  Procedures:  3/26 EGD Impression: Antral gastritis. 20 [place] gastrostomy tube placement into distal gastric body for feeding purposes.  Antibiotics:    HPI/Subjective: Tolerating gastric feeds, clear for discharge per Dr. Karilyn Cotaehman.  Non-verbal, daughter at bedside.  Objective: Filed Vitals:   06/30/14 2127 07/01/14 0611 07/01/14 2059 07/02/14 0436  BP:  160/108 140/85 102/61  Pulse: 100 99 88 81  Temp: 98.3 F (36.8 C) 98.2 F (36.8 C) 97.9 F (36.6 C) 97.7 F (36.5 C)  TempSrc: Axillary Axillary Axillary Axillary  Resp: 20 20 24 20   Height:      Weight:      SpO2: 100% 94%  100%    Intake/Output Summary (Last 24 hours) at 07/02/14 1327 Last data filed at 07/02/14 0100  Gross per 24 hour  Intake 261.33 ml  Output     18 ml  Net 243.33 ml     Filed Weights   06/26/14 1510 06/27/14 0454 06/28/14 0533  Weight: 49.896 kg (110 lb) 41.6 kg (91 lb 11.4 oz) 44.4 kg (97 lb 14.2 oz)    Exam:     Afebrile, VSS. No hypoxia. General:  Appears calm and comfortable Cardiovascular: RRR, no m/r/g. No LE edema. Respiratory: CTA bilaterally, no w/r/r. Normal respiratory effort. Abdomen: soft, nd, non-tender Psychiatric: non-verbal at baseline, does not respond  Data Reviewed:  Capillary blood sugars remain stable  WBC 5.6   Hemoglobin stable 10.3  Pending data:  Wound culture: MULTIPLE ORGANISMS PRESENT, NONE PREDOMINANT   Abdominal x-ray 3/29: No extraluminal contrast demonstrated.  Scheduled Meds: . feeding supplement (OSMOLITE 1.5 CAL)  1,000 mL Per Tube Q24H  . levothyroxine  150 mcg Oral QAC breakfast  . TRIPLE ANTIBIOTIC   Topical BID  Continuous Infusions: . sodium chloride irrigation 50 mL (06/27/14 1801)    Principal Problem:   Dementia Active Problems:   Dehydration   Failure to thrive in adult   Hypernatremia   Hypothyroidism   Protein-calorie malnutrition,  severe

## 2014-07-02 NOTE — Clinical Social Work Placement (Signed)
Clinical Social Work Department CLINICAL SOCIAL WORK PLACEMENT NOTE 07/02/2014  Patient:  Dana RummageYELLOCK,[name]  Account Number:  [id number] Admit date:  06/26/2014  Clinical Social Worker:  Derenda FennelKARA Verlena Marlette, LCSW  Date/time:  06/29/2014 01:18 PM  Clinical Social Work is seeking post-discharge placement for this patient at the following level of care:   SKILLED NURSING   (*CSW will update this form in Epic as items are completed)   06/29/2014  Patient/family provided with [facility] of Clinical Social Work's list of facilities offering this level of care within the geographic area requested by the patient (or if unable, by the patient's family).  06/29/2014  Patient/family informed of their freedom to choose among providers that offer the needed level of care, that participate in Medicare, Medicaid or managed care program needed by the patient, have an available bed and are willing to accept the patient.  06/29/2014  Patient/family informed of [facility]' ownership interest in [facility], as well as of the fact that they are under no obligation to receive care at this facility.  PASARR submitted to EDS on 06/29/2014 PASARR number received on 06/29/2014  FL2 transmitted to all facilities in geographic area requested by pt/family on  06/29/2014 FL2 transmitted to all facilities within larger geographic area on   Patient informed that his/her managed care company has contracts with or will negotiate with  certain facilities, including the following:     Patient/family informed of bed offers received:  06/30/2014 [facility] Physician recommends and patient chooses bed at    [facility] on  07/02/2014 [facility] Patient and family notified of transfer on 07/02/2014 Name of family member notified:  Zella BallRobin- daughter  The following physician  request were entered in Epic:   Additional Comments:  Derenda FennelKara Jaclin Finks, [place] [phone]

## 2014-07-02 NOTE — Progress Notes (Signed)
PEG tube flushed. Saline lock removed. Patient prepared for transfer to [facility]. Report called. Abdomen binder and hand mittens intact. Daughter at bedside.

## 2014-07-02 NOTE — Progress Notes (Signed)
Patient agitated. Ativan given IV . Daughters at bedside. Dressing changed to PEG tube site abdomen binder in use.

## 2014-07-02 NOTE — Progress Notes (Signed)
   Objective: BP 102/61 mmHg  Pulse 81  Temp(Src) 97.7 F (36.5 C) (Axillary)  Resp 20  Ht 5\' 7"  (1.702 m)  Wt 97 lb 14.2 oz (44.4 kg)  BMI 15.33 kg/m2  SpO2 100% Patient is restless and trying to get out of bed. Abdomen is flat and soft. Scant amount of exudate noted at gastrostomy tube site. No LE edema or clubbing noted.  Labs/studies Results:   Recent Labs  07/01/14 0548 07/02/14 0658  WBC 11.2* 5.6  HGB 11.8* 10.3*  HCT 36.3 31.9*  PLT 293 262     Culture from gastrostomy site multiple organism but nondominant.  Assessment:  #1. Patient is tolerating gastric feeding. Rate has been increased to 50 mL per hour. #2. Scant mucopurulent material at gastrostomy tube site. Cultures revealed multiple organisms but nondominant.  #3. Restless and agitation possibly due to underlying dementia. Patient remains with intermittent agitation and restlessness responding to lorazepam. #4. Mild anemia. H&H is stable.  Recommendations;  Apply Neosporin twice a day to gastrostomy site for 1 week. Use abdominal binder for 1 more week.

## 2014-07-02 NOTE — Progress Notes (Signed)
Pt becoming restless, agitated, and hitting staff again. Knees drawn up to chest, fist clenched, arm over abdomen. Pain and anxiety medication given and abdominal binder loosened. These interventions were effective.

## 2014-07-02 NOTE — Discharge Summary (Signed)
Physician Discharge Summary  Dana RummageBetty Jean Grimes [id number] DOB: 10/28/1945 DOA: 06/26/2014  PCP: Dana Grimes, ANDREW, NP  Admit date: 06/26/2014 Discharge date: 07/02/2014  Recommendations for Outpatient Follow-up:  1. End-stage dementia. Suggest ongoing goals of care discussion. 2. Behavioral disturbance secondary to dementia. See discussion below. 3. Placement of feeding tube this admission. Free water flushes 300 ml TID. Tube feeds Osmolite 1.5 @ 50 ml/hr. 4. Hypothyroidism. TSH low this admission and thyroid supplementation was decreased. Recommend checking thyroid level IV-VI weeks, approximately mid May. 5. Severe malnutrition 6. Apply Neosporin twice a day to gastrostomy site for one week 7. Use abdominal binder for 1 more week   Follow-up Information    Follow up with Dana Grimes, ANDREW, NP. Schedule an appointment as soon as possible for a visit in 2 weeks.   Specialty:  Nurse Practitioner   Contact information:   [address] [place] [id number] [phone]      Discharge Diagnoses:  1. End-stage dementia with anxiety and behavioral disturbance, failure to thrive, poor oral intake 2. Failure to thrive, dehydration, hypernatremia 3. Normocytic anemia 4. Chronic kidney disease stage II, suspected 5. Hypothyroidism 6. Severe malnutrition  Discharge Condition: improved Disposition: SNF  Diet recommendation: Free water flushes 300 ml TID. Tube feeds Osmolite 1.5 @ 50 ml/hr. PO diet as desired.  Filed Weights   06/26/14 1510 06/27/14 0454 06/28/14 0533  Weight: 49.896 kg (110 lb) 41.6 kg (91 lb 11.4 oz) 44.4 kg (97 lb 14.2 oz)    History of present illness:  69 year old woman with advanced Alzheimer's dementia presented with failure to thrive, poor oral intake and was admitted for GI consultation and PEG tube placement after goals of care discussion between EDP and daughter.  Hospital Course:  Dehydration resolved with IV fluids. PEG tube was placed 3/26 by  gastroenterology. She was unable to return to her living facility with a PEG tube and therefore hospitalization was prolonged until another facility could be found. Prior to discharge she developed significant abdominal pain and gastric residuals  Which resolved over the course of 24 hours was thought to be secondary to temporary gastroparesis from narcotics. She has been cleared for discharge by gastroenterology. She has advanced dementia with chronic behavioral disturbance. After discussion with daughter, the patient failed Seroquel in the past and the daughter does not want to be on Risperdal. We discussed treatment options and at this point she would like to go with low-dose scheduled benzodiazepine as well as as needed dosing. She understands that this medication can cause sedation and falls, especially in the elderly, however she believes the risk/benefit favor this medication and the patient has tolerated Xanax in the past. Patient's anxiety puts her at risk for falls in self-harm accidentally. She reports the patient was on a normal diet prior to admission.  1. End-stage dementia with anxiety, with resultant failure to thrive and poor oral intake. Status post PEG tube placement 3/26. Nonverbal at baseline. Daughter does not want the patient to take Risperdal. 2. Large gastric residual, abdominal pain; resolved. 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 Tolerating tube feeds, clear for discharge per GI. AXR without acute features or evidence of leak. 3. FTT, dehydration, hypernatremia. Resolved with IV fluids. 4. Normocytic anemia, remains stable. 5. 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 Hypothyroidism. TSH 0.013. Decrease dose of Synthroid and recheck in 4-6 weeks. 7. Severe malnutrition   Decreased Synthroid and follow-up TSH in 4-6 weeks  Apply Neosporin twice a day to gastrostomy site for 1  week.  Use abdominal binder for 1 more week.  Discharge Instructions  Discharge  Instructions    Diet general    Complete by:  As directed      Increase activity slowly    Complete by:  As directed           Current Discharge Medication List    START taking these medications   Details  !! LORazepam (ATIVAN) 0.5 MG tablet Take 1 tablet (0.5 mg total) by mouth 2 (two) times daily. Hold for sedation. Qty: 20 tablet, Refills: 0    !! LORazepam (ATIVAN) 0.5 MG tablet Take 1 tablet (0.5 mg total) by mouth daily as needed for anxiety (at lunctime if needed). Qty: 10 tablet, Refills: 0    Neomycin-Bacitracin-Polymyxin (TRIPLE ANTIBIOTIC) [phone] OINT Apply daily to gastrostomy tube site for 7 days Refills: 0    Nutritional Supplements (FEEDING SUPPLEMENT, OSMOLITE 1.5 CAL,) LIQD Place 1,000 mLs into feeding tube daily.     !! - Potential duplicate medications found. Please discuss with provider.    CONTINUE these medications which have CHANGED   Details  levothyroxine (SYNTHROID, LEVOTHROID) 150 MCG tablet Take 1 tablet (150 mcg total) by mouth every morning.      CONTINUE these medications which have NOT CHANGED   Details  aspirin 81 MG chewable tablet Chew 81 mg by mouth daily.    cholecalciferol (VITAMIN D) 1000 UNITS tablet Take 1,000 Units by mouth every morning.    polyethylene glycol powder (GLYCOLAX/MIRALAX) powder Take 17 g by mouth every Monday, Wednesday, and Friday. *Mixed in 8 oz of water/beverage and drink daily on M-W-F      STOP taking these medications     ALPRAZolam (XANAX) 0.25 MG tablet      risperiDONE (RISPERDAL) 2 MG tablet        No Known Allergies  The results of significant diagnostics from this hospitalization (including imaging, microbiology, ancillary and laboratory) are listed below for reference.    Significant Diagnostic Studies: Dg Abd 1 View  06/30/2014   CLINICAL DATA:  Status post PEG tube placement on March 26 60 cc of Omnipaque 300 injected through the tube ; the patient was unable to cooperate for optimal  positioning.  EXAM: ABDOMEN - 1 VIEW  COMPARISON:  None.  FINDINGS: The gastrostomy tube balloon projects over the mid body of the stomach. There is contrast present in the gastric cardia which is dependent. There is gas in the remainder of the stomach.  IMPRESSION: No extraluminal contrast is demonstrated.   Electronically Signed   By: David  [place]   On: 06/30/2014 17:16    Microbiology: Recent Results (from the past 240 hour(s))  Surgical pcr screen     Status: None   Collection Time: 06/27/14  4:40 AM  Result Value Ref Range Status   MRSA, PCR NEGATIVE NEGATIVE Final   Staphylococcus aureus NEGATIVE NEGATIVE Final    Comment:        The Xpert SA Assay (FDA approved for NASAL specimens in patients over 52 years of age), is one component of a comprehensive surveillance program.  Test performance has been validated by [facility] for patients greater than or equal to 26 year old. It is not intended to diagnose infection nor to guide or monitor treatment.   Wound culture     Status: None (Preliminary result)   Collection Time: 06/30/14  4:14 PM  Result Value Ref Range Status   Specimen Description ABDOMEN  Final   Special  Requests Normal  Final   Gram Stain   Final    FEW WBC PRESENT,BOTH PMN AND MONONUCLEAR NO SQUAMOUS EPITHELIAL CELLS SEEN NO ORGANISMS SEEN Performed at [facility]    Culture   Final    MULTIPLE ORGANISMS PRESENT, NONE PREDOMINANT Performed at [facility]    Report Status PENDING  Incomplete     Labs: Basic Metabolic Panel:  Recent Labs Lab 06/26/14 1750 06/27/14 0657 06/28/14 1119 06/29/14 0633 07/01/14 0548  NA 148* 149* 141 140 141  K 3.5 3.5 2.8* 4.0 4.8  CL 111 116* 108 112 103  CO2 GLUCOSE 139* 111* 116* 121* 99  BUN 27* CREATININE 0.96 0.83 0.76 0.78 0.71  CALCIUM 9.2 8.7 8.3* 8.0* 9.4  MG 2.1  --  1.5  --   --    Liver Function Tests:  Recent Labs Lab 06/26/14 1750 06/27/14 0657    AST 24 19  ALT 14 12  ALKPHOS 56 49  BILITOT 0.6 0.6  PROT 7.3 6.0  ALBUMIN 4.0 3.3*   CBC:  Recent Labs Lab 06/26/14 1750 06/27/14 0657 06/28/14 1119 06/29/14 0633 07/01/14 0548 07/02/14 0658  WBC 6.1 6.6 10.9* 9.3 11.2* 5.6  NEUTROABS 4.8 5.0  --   --   --   --   HGB 11.3* 10.4* 10.7* 9.7* 11.8* 10.3*  HCT 35.5* 32.5* 32.5* 29.5* 36.3 31.9*  MCV 80.9 80.6 79.7 79.7 79.4 79.0  PLT 241 203 180 186 293 262    CBG:  Recent Labs Lab 07/01/14 1125 07/01/14 1701 07/02/14 0014 07/02/14 0413 07/02/14 1200  GLUCAP 101* 115* 127* 83 73    Principal Problem:   Dementia Active Problems:   Dehydration   Failure to thrive in adult   Hypernatremia   Hypothyroidism   Protein-calorie malnutrition, severe   Time coordinating discharge: 35 minutes  Signed:  Brendia Sacks, MD [facility] 07/02/2014, 2:31 PM

## 2014-07-03 NOTE — Care Management Utilization Note (Signed)
UR completed 

## 2014-07-06 LAB — WOUND CULTURE: Special Requests: NORMAL

## 2015-09-04 ENCOUNTER — Emergency Department
Admission: EM | Admit: 2015-09-04 | Discharge: 2015-09-04 | Disposition: A | Discharge status: Home / Self Care | Payer: Medicare Other | Attending: Emergency Medicine | Admitting: Emergency Medicine

## 2015-09-04 ENCOUNTER — Emergency Department: Payer: Medicare Other

## 2015-09-04 DIAGNOSIS — K9423 Gastrostomy malfunction: Secondary | ICD-10-CM | POA: Diagnosis present

## 2015-09-04 DIAGNOSIS — N189 Chronic kidney disease, unspecified: Secondary | ICD-10-CM | POA: Diagnosis not present

## 2015-09-04 DIAGNOSIS — M1990 Unspecified osteoarthritis, unspecified site: Secondary | ICD-10-CM | POA: Insufficient documentation

## 2015-09-04 DIAGNOSIS — Z79899 Other long term (current) drug therapy: Secondary | ICD-10-CM | POA: Insufficient documentation

## 2015-09-04 DIAGNOSIS — Z7982 Long term (current) use of aspirin: Secondary | ICD-10-CM | POA: Diagnosis not present

## 2015-09-04 DIAGNOSIS — I129 Hypertensive chronic kidney disease with stage 1 through stage 4 chronic kidney disease, or unspecified chronic kidney disease: Secondary | ICD-10-CM | POA: Insufficient documentation

## 2015-09-04 DIAGNOSIS — E039 Hypothyroidism, unspecified: Secondary | ICD-10-CM | POA: Diagnosis not present

## 2015-09-04 MED ORDER — IOHEXOL 300 MG/ML  SOLN
75.0000 mL | Freq: Once | INTRAMUSCULAR | Status: AC | PRN
  Administered 2015-09-04: 75 mL

## 2015-09-04 NOTE — Discharge Instructions (Signed)
Gastrostomy Tube Replacement, Care After °Refer to this sheet in the next few weeks. These instructions provide you with information on caring for yourself after your procedure. Your health care provider may also give you more specific instructions. Your treatment has been planned according to current medical practices, but problems sometimes occur. Call your health care provider if you have any problems or questions after your procedure. °WHAT TO EXPECT AFTER THE PROCEDURE  °After your procedure, it is typical to have the following:  °· Mild abdominal pain. °· A small amount of blood-tinged fluid leaking from the replacement site. °HOME CARE INSTRUCTIONS °· You may resume your normal level of activity. °· You may resume your normal feedings. °· Care for your gastrostomy tube as you did before, or as directed by your health care provider. °SEEK MEDICAL CARE IF: °· You have a fever or chills.  °· You have redness or irritation near the insertion site. °· You continue to have abdominal pain or leaking around your gastrostomy tube. °SEEK IMMEDIATE MEDICAL CARE IF:  °· You develop bleeding or significant discharge around the tube. °· You have severe abdominal pain.  °· Your new tube does not seem to be working properly. °· You are unable to get feedings into the tube. °· Your tube comes out for any reason.   °  °This information is not intended to replace advice given to you by your health care provider. Make sure you discuss any questions you have with your health care provider. °  °Document Released: 10/15/2013 Document Reviewed: 10/15/2013 °Elsevier Interactive Patient Education ©2016 [employer] ° °

## 2015-09-04 NOTE — ED Notes (Signed)
Discussed discharge instructions and follow-up care with patient's POA at bedside. No questions or concerns at this time. Pt stable at discharge. [facility]. MD aware of transportation change from EMS.

## 2015-09-04 NOTE — ED Provider Notes (Signed)
[facility]   ____________________________________________  Time seen: Approximately 1005 AM  I have reviewed the triage vital signs and the nursing notes.   HISTORY  Chief Complaint GI Problem   HPI Dana Grimes is a 70 y.o. female who had a gastric tube placed in March 2016 who is presenting with a displaced PEG tube. She was found with her PEG tube displaced this morning. She otherwise has no issues per the nursing home staff. She also has 2 daughters at the bedside who, as far as they know, is not having any change in mental status or any other issues at this time.   Past Medical History  Diagnosis Date  . Hypertension   . Chronic kidney disease   . Thyroid disease     hypothyroidism  . Gastric reflux   . Alzheimer's dementia   . Arthritis   . Vitamin D deficiency   . Abnormal weight loss     Patient Active Problem List   Diagnosis Date Noted  . Protein-calorie malnutrition, severe (HCC) 06/27/2014  . Dehydration 06/26/2014  . Failure to thrive in adult 06/26/2014  . Hypernatremia 06/26/2014  . Dementia 06/26/2014  . Hypothyroidism 06/26/2014    Past Surgical History  Procedure Laterality Date  . Abdominal hysterectomy    . Peg placement N/A 06/27/2014    Procedure: PERCUTANEOUS ENDOSCOPIC GASTROSTOMY (PEG) PLACEMENT;  Surgeon: Malissa Hippo, MD;  [facility];  Service: Endoscopy;  Laterality: N/A;    Current Outpatient Rx  Name  Route  Sig  Dispense  Refill  . aspirin 81 MG chewable tablet   Oral   Chew 81 mg by mouth daily.         . cholecalciferol (VITAMIN D) 1000 UNITS tablet   Oral   Take 1,000 Units by mouth every morning.         Marland Kitchen levothyroxine (SYNTHROID, LEVOTHROID) 150 MCG tablet   Oral   Take 1 tablet (150 mcg total) by mouth every morning.         Marland Kitchen LORazepam (ATIVAN) 0.5 MG tablet   Oral   Take 1 tablet (0.5 mg total) by mouth 2 (two) times daily.  Hold for sedation.   20 tablet   0   . LORazepam (ATIVAN) 0.5 MG tablet   Oral   Take 1 tablet (0.5 mg total) by mouth daily as needed for anxiety (at lunctime if needed).   10 tablet   0   . Neomycin-Bacitracin-Polymyxin (TRIPLE ANTIBIOTIC) [phone] OINT      Apply daily to gastrostomy tube site for 7 days      0   . Nutritional Supplements (FEEDING SUPPLEMENT, OSMOLITE 1.5 CAL,) LIQD   Per Tube   Place 1,000 mLs into feeding tube daily.         . polyethylene glycol powder (GLYCOLAX/MIRALAX) powder   Oral   Take 17 g by mouth every Monday, Wednesday, and Friday. *Mixed in 8 oz of water/beverage and drink daily on M-W-F           Allergies Review of patient's allergies indicates no known allergies.  History reviewed. No pertinent family history.  Social History Social History  Substance Use Topics  . Smoking status: Never Smoker   . Smokeless tobacco: None  . Alcohol Use: No    Review of Systems Caveat secondary to patient with advanced dementia.  ____________________________________________   PHYSICAL EXAM:  VITAL SIGNS: ED Triage Vitals  Enc  Vitals Group     BP 09/04/15 0924 127/75 mmHg     Pulse Rate 09/04/15 0924 61     Resp 09/04/15 0924 16     Temp 09/04/15 0924 97.9 F (36.6 C)     Temp Source 09/04/15 0924 Axillary     SpO2 09/04/15 0924 100 %     Weight 09/04/15 0924 130 lb (58.968 kg)     Height 09/04/15 0924 5\' 5"  (1.651 m)     Head Cir --      Peak Flow --      Pain Score --      Pain Loc --      Pain Edu? --      Excl. in GC? --     Constitutional: Alert. Well appearing and in no acute distress. Eyes: Conjunctivae are normal. PERRL. EOMI. Head: Atraumatic. Nose: No congestion/rhinnorhea. Mouth/Throat: Mucous membranes are moist.   Neck: No stridor.   Cardiovascular: Normal rate, regular rhythm. Grossly normal heart sounds.   Respiratory: Normal respiratory effort.  No retractions. Lungs CTAB. Gastrointestinal: Soft and  nontender. No distention. Left upper quadrant PEG tube stoma with healthy-appearing granulation tissue. No active bleeding, induration or pus surrounding. Musculoskeletal: No lower extremity tenderness nor edema.  No joint effusions. Neurologic:  No gross focal neurologic deficits are appreciated.  Skin:  Skin is warm, dry and intact. No rash noted. Psychiatric: Mood and affect are normal.   ____________________________________________   LABS (all labs ordered are listed, but only abnormal results are displayed)  Labs Reviewed - No data to display ____________________________________________  EKG   ____________________________________________  RADIOLOGY   DG Abd 1 View (Final result) Result time: 09/04/15 11:02:00   Final result by Rad Results In Interface (09/04/15 11:02:00)   Narrative:   CLINICAL DATA: Pulled gastric tube out today. Replaced in the ER.  EXAM: ABDOMEN - 1 VIEW  COMPARISON: 06/30/2014  FINDINGS: Feeding tube projects over the midportion of the stomach with contrast in the mid and distal stomach. No contrast extravasation or free air. Normal bowel gas pattern. No organomegaly or suspicious calcification.  IMPRESSION: Gastrostomy tube projects over the mid stomach with contrast in the mid and distal stomach.   Electronically Signed By: Charlett Nose M.D. On: 09/04/2015 11:02       ____________________________________________   PROCEDURES  Gastrostomy tube replacement Performed by: Arelia Longest Consent: Verbal consent obtained. Risks and benefits: risks, benefits and alternatives were discussed Required items: required blood products, implants, devices, and special equipment available Patient identity confirmed: hospital-assigned identification number Time out: Immediately prior to procedure a "time out" was called to verify the correct patient, procedure, equipment, support staff and site/side marked as  required. Preparation: Patient was prepped and draped in the usual sterile fashion. Patient tolerance: Patient tolerated the procedure well with no immediate complications.  Comments: 14 french Gastrostomy tube placed without difficulty Tended to pass 18 and then 16 without success.  14 [place] passed easily.  ____________________________________________   INITIAL IMPRESSION / ASSESSMENT AND PLAN / ED COURSE  Pertinent labs & imaging results that were available during my care of the patient were reviewed by me and considered in my medical decision making (see chart for details).  ----------------------------------------- 11:20 AM on 09/04/2015 -----------------------------------------  Patient with successful reinsertion of the gastrostomy tube. Will be discharged back to her skilled nursing facility. ____________________________________________   FINAL CLINICAL IMPRESSION(S) / ED DIAGNOSES  PEG tube malfunction.    NEW MEDICATIONS STARTED DURING THIS VISIT:  New Prescriptions   No medications on file     Note:  This document was prepared using Dragon voice recognition software and may include unintentional dictation errors.    Myrna Blazeravid Matthew Schaevitz, MD 09/04/15 1120

## 2015-09-04 NOTE — ED Notes (Signed)
Dressing applied to new PEG site.

## 2015-09-04 NOTE — ED Notes (Signed)
Unable to have POA sign e-signature pad. Device stating "Cannot generate signature HTML file." Pt left with family and discharge instructions for [facility].

## 2015-09-04 NOTE — ED Notes (Signed)
Pt presents via [facility] from [facility]. Staff noticed this am at apx 0815 that pt removed G tube. Here for same. Hx dementia.

## 2016-01-04 ENCOUNTER — Emergency Department
Admission: EM | Admit: 2016-01-04 | Discharge: 2016-01-05 | Disposition: A | Discharge status: Home / Self Care | Payer: Medicare Other | Attending: Emergency Medicine | Admitting: Emergency Medicine

## 2016-01-04 ENCOUNTER — Encounter: Payer: Self-pay | Admitting: Emergency Medicine

## 2016-01-04 DIAGNOSIS — G309 Alzheimer's disease, unspecified: Secondary | ICD-10-CM | POA: Insufficient documentation

## 2016-01-04 DIAGNOSIS — Z431 Encounter for attention to gastrostomy: Secondary | ICD-10-CM

## 2016-01-04 DIAGNOSIS — E039 Hypothyroidism, unspecified: Secondary | ICD-10-CM | POA: Diagnosis not present

## 2016-01-04 DIAGNOSIS — N189 Chronic kidney disease, unspecified: Secondary | ICD-10-CM | POA: Diagnosis not present

## 2016-01-04 DIAGNOSIS — I129 Hypertensive chronic kidney disease with stage 1 through stage 4 chronic kidney disease, or unspecified chronic kidney disease: Secondary | ICD-10-CM | POA: Diagnosis not present

## 2016-01-04 DIAGNOSIS — Z7982 Long term (current) use of aspirin: Secondary | ICD-10-CM | POA: Diagnosis not present

## 2016-01-04 DIAGNOSIS — K9423 Gastrostomy malfunction: Secondary | ICD-10-CM | POA: Diagnosis present

## 2016-01-04 DIAGNOSIS — Z79899 Other long term (current) drug therapy: Secondary | ICD-10-CM | POA: Diagnosis not present

## 2016-01-04 DIAGNOSIS — T85528A Displacement of other gastrointestinal prosthetic devices, implants and grafts, initial encounter: Secondary | ICD-10-CM

## 2016-01-04 HISTORY — DX: Unspecified dementia, unspecified severity, without behavioral disturbance, psychotic disturbance, mood disturbance, and anxiety: F03.90

## 2016-01-04 HISTORY — DX: Anxiety disorder, unspecified: F41.9

## 2016-01-04 HISTORY — DX: Major depressive disorder, single episode, unspecified: F32.9

## 2016-01-04 HISTORY — DX: Unspecified mood (affective) disorder: F39

## 2016-01-04 NOTE — ED Notes (Signed)
Spoke with Clance BollNarjette Abdoulaye, LPN at [facility], reports "when I passed out meds at around 5 pm she had it but it was about 10 pm when the CNA told me she pulled it out."

## 2016-01-04 NOTE — ED Notes (Signed)
Spoke with Clance BollNarjette Abdoulaye, LPN, at [facility], reports pt had 20 Fr. g-tube in place.

## 2016-01-04 NOTE — ED Triage Notes (Signed)
Pt presents to ED from [facility] via [facility] for g-tube placement. Per EMS facility staff unsure of how long g-tube has been removed. Per EMS LPN reports unable to replace g-tube. Pt does not appear in any distress, pt calm.

## 2016-01-05 ENCOUNTER — Emergency Department: Payer: Medicare Other

## 2016-01-05 DIAGNOSIS — K9423 Gastrostomy malfunction: Secondary | ICD-10-CM | POA: Diagnosis not present

## 2016-01-05 MED ORDER — DIATRIZOATE MEGLUMINE & SODIUM 66-10 % PO SOLN
30.0000 mL | Freq: Once | ORAL | Status: AC
  Administered 2016-01-05: 30 mL via ORAL

## 2016-01-05 NOTE — ED Notes (Signed)
Dr. Manson PasseyBrown in patient room to insert gastrostomy tube, pt tolerated procedure well. Family at bedside.

## 2016-01-05 NOTE — ED Provider Notes (Signed)
[facility] Emergency Department Provider Note   First MD Initiated Contact with Patient 01/04/16 2355     (approximate)  I have reviewed the triage vital signs and the nursing notes.   HISTORY  Chief Complaint G-tube placement   HPI Dana Grimes is a 70 y.o. female with history of Alzheimer's dementia presents with dislodged G-tube since approximately 5 PM this afternoon. Patient was recently seen for the same with documentation stated that a 42 [place] G-tube was inserted by Dr. Langston Masker.   Past Medical History:  Diagnosis Date  . Abnormal weight loss   . Alzheimer's dementia   . Anxiety   . Arthritis   . Chronic kidney disease   . Dementia without behavioral disturbance   . Gastric reflux   . Hypertension   . Major depressive disorder   . Thyroid disease    hypothyroidism  . Unspecified mood (affective) disorder (HCC)   . Vitamin D deficiency     Patient Active Problem List   Diagnosis Date Noted  . Protein-calorie malnutrition, severe (HCC) 06/27/2014  . Dehydration 06/26/2014  . Failure to thrive in adult 06/26/2014  . Hypernatremia 06/26/2014  . Dementia 06/26/2014  . Hypothyroidism 06/26/2014    Past Surgical History:  Procedure Laterality Date  . ABDOMINAL HYSTERECTOMY    . PEG PLACEMENT N/A 06/27/2014   Procedure: PERCUTANEOUS ENDOSCOPIC GASTROSTOMY (PEG) PLACEMENT;  Surgeon: Malissa Hippo, MD;  [facility];  Service: Endoscopy;  Laterality: N/A;    Prior to Admission medications   Medication Sig Start Date End Date Taking? Authorizing Provider  aspirin 81 MG chewable tablet Chew 81 mg by mouth daily.    Historical Provider, MD  cholecalciferol (VITAMIN D) 1000 UNITS tablet Take 1,000 Units by mouth every morning.    Historical Provider, MD  levothyroxine (SYNTHROID, LEVOTHROID) 150 MCG tablet Take 1 tablet (150 mcg total) by mouth every morning. 07/02/14   Standley Brooking, MD  LORazepam (ATIVAN) 0.5 MG  tablet Take 1 tablet (0.5 mg total) by mouth 2 (two) times daily. Hold for sedation. 07/02/14   Standley Brooking, MD  LORazepam (ATIVAN) 0.5 MG tablet Take 1 tablet (0.5 mg total) by mouth daily as needed for anxiety (at lunctime if needed). 07/02/14   Standley Brooking, MD  Neomycin-Bacitracin-Polymyxin (TRIPLE ANTIBIOTIC) [phone] OINT Apply daily to gastrostomy tube site for 7 days 07/02/14   Standley Brooking, MD  Nutritional Supplements (FEEDING SUPPLEMENT, OSMOLITE 1.5 CAL,) LIQD Place 1,000 mLs into feeding tube daily. 07/02/14   Standley Brooking, MD  polyethylene glycol powder [facility]) powder Take 17 g by mouth every Monday, Wednesday, and Friday. *Mixed in 8 oz of water/beverage and drink daily on M-W-F    Historical Provider, MD    Allergies No known drug allergies No family history on file.  Social History Social History  Substance Use Topics  . Smoking status: Never Smoker  . Smokeless tobacco: Never Used  . Alcohol use No    Review of Systems Constitutional: No fever/chills Eyes: No visual changes. ENT: No sore throat. Cardiovascular: Denies chest pain. Respiratory: Denies shortness of breath. Gastrointestinal: No abdominal pain.  No nausea, no vomiting.  No diarrhea.  No constipation. Genitourinary: Negative for dysuria. Musculoskeletal: Negative for back pain. Skin: Negative for rash. Neurological: Negative for headaches, focal weakness or numbness.  10-point ROS otherwise negative.  ____________________________________________   PHYSICAL EXAM:  VITAL SIGNS: ED Triage Vitals [01/04/16 2341]  Enc Vitals Group  BP 113/65     Pulse Rate 99     Resp 18     Temp 98.3 F (36.8 C)     Temp Source Axillary     SpO2 97 %     Weight 123 lb (55.8 kg)     Height 5' (1.524 m)     Head Circumference      Peak Flow      Pain Score      Pain Loc      Pain Edu?      Excl. in GC?     Constitutional: Alert and oriented. Well appearing and in no  acute distress. Eyes: Conjunctivae are normal. PERRL. EOMI. Head: Atraumatic. Ears:  Healthy appearing ear canals and TMs bilaterally Nose: No congestion/rhinnorhea. Mouth/Throat: Mucous membranes are moist.  Oropharynx non-erythematous. Neck: No stridor.  No meningeal signs.   Cardiovascular: Normal rate, regular rhythm. Good peripheral circulation. Grossly normal heart sounds. Respiratory: Normal respiratory effort.  No retractions. Lungs CTAB. Gastrointestinal: Soft and nontender. No distention.  Musculoskeletal: No lower extremity tenderness nor edema. No gross deformities of extremities. Neurologic:  Normal speech and language. No gross focal neurologic deficits are appreciated.  Skin:  Skin is warm, dry and intact. No rash noted. Psychiatric: Mood and affect are normal. Speech and behavior are normal.    RADIOLOGY I, [place] N Dakisha Schoof, personally viewed and evaluated these images (plain radiographs) as part of my medical decision making, as well as reviewing the written report by the radiologist.  Dg Abdomen 1 View  Result Date: 01/05/2016 CLINICAL DATA:  Gastrostomy tube replacement. Tube was injected with contrast material. EXAM: ABDOMEN - 1 VIEW COMPARISON:  09/04/2015 FINDINGS: Gastrostomy tube is present with balloon in the stomach. Contrast material is within the stomach and duodenum suggesting appropriate positioning of the gastrostomy tube within the stomach. No contrast extravasation. IMPRESSION: Gastrostomy tube appears in place with balloon and tip in the stomach. No contrast extravasation. Electronically Signed   By: Burman NievesWilliam  Stevens M.D.   On: 01/05/2016 02:36     Gastrostomy tube replacement Date/Time: 01/05/2016 12:15 AM Performed by: Darci CurrentBROWN, [place] N Authorized by: Darci CurrentBROWN, [place] N  Consent: Verbal consent obtained. Consent given by: power of attorney Relevant documents: relevant documents present and verified Test results: test results available and properly  labeled Imaging studies: imaging studies available Patient identity confirmed: arm band and provided demographic data Local anesthesia used: no  Anesthesia: Local anesthesia used: no  Sedation: Patient sedated: no Patient tolerance: Patient tolerated the procedure well with no immediate complications        INITIAL IMPRESSION / ASSESSMENT AND PLAN / ED COURSE  Pertinent labs & imaging results that were available during my care of the patient were reviewed by me and considered in my medical decision making (see chart for details).     Clinical Course    ____________________________________________  FINAL CLINICAL IMPRESSION(S) / ED DIAGNOSES  Final diagnoses:  Dislodged gastrostomy tube (HCC)     MEDICATIONS GIVEN DURING THIS VISIT:  Medications  diatrizoate meglumine-sodium (GASTROGRAFIN) 66-10 % solution 30 mL (30 mLs Oral Contrast Given 01/05/16 0104)     NEW OUTPATIENT MEDICATIONS STARTED DURING THIS VISIT:  Discharge Medication List as of 01/05/2016  1:02 AM      Discharge Medication List as of 01/05/2016  1:02 AM      Discharge Medication List as of 01/05/2016  1:02 AM       Note:  This document was prepared using Dragon voice recognition  software and may include unintentional dictation errors.    Darci Current, MD 01/05/16 [phone]

## 2016-05-18 ENCOUNTER — Encounter: Payer: Self-pay | Admitting: Emergency Medicine

## 2016-05-18 ENCOUNTER — Emergency Department: Payer: Medicare Other

## 2016-05-18 ENCOUNTER — Emergency Department
Admission: EM | Admit: 2016-05-18 | Discharge: 2016-05-18 | Disposition: A | Discharge status: Home / Self Care | Payer: Medicare Other | Attending: Emergency Medicine | Admitting: Emergency Medicine

## 2016-05-18 DIAGNOSIS — Z79899 Other long term (current) drug therapy: Secondary | ICD-10-CM | POA: Diagnosis not present

## 2016-05-18 DIAGNOSIS — I129 Hypertensive chronic kidney disease with stage 1 through stage 4 chronic kidney disease, or unspecified chronic kidney disease: Secondary | ICD-10-CM | POA: Insufficient documentation

## 2016-05-18 DIAGNOSIS — K9423 Gastrostomy malfunction: Secondary | ICD-10-CM | POA: Insufficient documentation

## 2016-05-18 DIAGNOSIS — G309 Alzheimer's disease, unspecified: Secondary | ICD-10-CM | POA: Insufficient documentation

## 2016-05-18 DIAGNOSIS — N189 Chronic kidney disease, unspecified: Secondary | ICD-10-CM | POA: Diagnosis not present

## 2016-05-18 DIAGNOSIS — E039 Hypothyroidism, unspecified: Secondary | ICD-10-CM | POA: Diagnosis not present

## 2016-05-18 DIAGNOSIS — Z7982 Long term (current) use of aspirin: Secondary | ICD-10-CM | POA: Diagnosis not present

## 2016-05-18 DIAGNOSIS — Z431 Encounter for attention to gastrostomy: Secondary | ICD-10-CM

## 2016-05-18 DIAGNOSIS — Z434 Encounter for attention to other artificial openings of digestive tract: Secondary | ICD-10-CM

## 2016-05-18 DIAGNOSIS — T85528A Displacement of other gastrointestinal prosthetic devices, implants and grafts, initial encounter: Secondary | ICD-10-CM

## 2016-05-18 MED ORDER — LIDOCAINE HCL 2 % EX GEL
CUTANEOUS | Status: AC
  Filled 2016-05-18: qty 10

## 2016-05-18 NOTE — ED Notes (Signed)
EMS here to transfer pt back to [facility].

## 2016-05-18 NOTE — ED Provider Notes (Signed)
[facility] Emergency Department Provider Note   ____________________________________________   First MD Initiated Contact with Patient 05/18/16 [phone]     (approximate)  I have reviewed the triage vital signs and the nursing notes.   HISTORY  Chief Complaint GI Problem  History limited by baseline nonverbal state  HPI Dana Grimes is a 71 y.o. female with severe Alzheimer's dementia brought [facility] the ED [facility] with a chief complaint of G-tube dislodgment. Patient is seen frequently in the ED for same. Night nurse reports finding patient's G-tube out with balloon intact. Rest of history is limited secondary to patient's nonverbal status.   Past Medical History:  Diagnosis Date  . Abnormal weight loss   . Alzheimer's dementia   . Anxiety   . Arthritis   . Chronic kidney disease   . Dementia without behavioral disturbance   . Gastric reflux   . Hypertension   . Major depressive disorder   . Thyroid disease    hypothyroidism  . Unspecified mood (affective) disorder (HCC)   . Vitamin D deficiency     Patient Active Problem List   Diagnosis Date Noted  . Protein-calorie malnutrition, severe (HCC) 06/27/2014  . Dehydration 06/26/2014  . Failure to thrive in adult 06/26/2014  . Hypernatremia 06/26/2014  . Dementia 06/26/2014  . Hypothyroidism 06/26/2014    Past Surgical History:  Procedure Laterality Date  . ABDOMINAL HYSTERECTOMY    . PEG PLACEMENT N/A 06/27/2014   Procedure: PERCUTANEOUS ENDOSCOPIC GASTROSTOMY (PEG) PLACEMENT;  Surgeon: Malissa HippoNajeeb U Rehman, MD;  [facility];  Service: Endoscopy;  Laterality: N/A;    Prior to Admission medications   Medication Sig Start Date End Date Taking? Authorizing Provider  aspirin 81 MG chewable tablet Chew 81 mg by mouth daily.    Historical Provider, MD  cholecalciferol (VITAMIN D) 1000 UNITS tablet Take 1,000 Units by mouth every morning.    Historical Provider, MD   levothyroxine (SYNTHROID, LEVOTHROID) 150 MCG tablet Take 1 tablet (150 mcg total) by mouth every morning. 07/02/14   Standley Brookinganiel P Goodrich, MD  LORazepam (ATIVAN) 0.5 MG tablet Take 1 tablet (0.5 mg total) by mouth 2 (two) times daily. Hold for sedation. 07/02/14   Standley Brookinganiel P Goodrich, MD  LORazepam (ATIVAN) 0.5 MG tablet Take 1 tablet (0.5 mg total) by mouth daily as needed for anxiety (at lunctime if needed). 07/02/14   Standley Brookinganiel P Goodrich, MD  Neomycin-Bacitracin-Polymyxin (TRIPLE ANTIBIOTIC) [phone] OINT Apply daily to gastrostomy tube site for 7 days 07/02/14   Standley Brookinganiel P Goodrich, MD  Nutritional Supplements (FEEDING SUPPLEMENT, OSMOLITE 1.5 CAL,) LIQD Place 1,000 mLs into feeding tube daily. 07/02/14   Standley Brookinganiel P Goodrich, MD  polyethylene glycol powder [facility](GLYCOLAX/MIRALAX) powder Take 17 g by mouth every Monday, Wednesday, and Friday. *Mixed in 8 oz of water/beverage and drink daily on M-W-F    Historical Provider, MD    Allergies Patient has no known allergies.  History reviewed. No pertinent family history.  Social History Social History  Substance Use Topics  . Smoking status: Never Smoker  . Smokeless tobacco: Never Used  . Alcohol use No    Review of Systems  Constitutional: No fever/chills. Eyes: No visual changes. ENT: No sore throat. Cardiovascular: Denies chest pain. Respiratory: Denies shortness of breath. Gastrointestinal: Positive for G-tube dislodgment. No abdominal pain.  No nausea, no vomiting.  No diarrhea.  No constipation. Genitourinary: Negative for dysuria. Musculoskeletal: Negative for back pain. Skin: Negative for rash. Neurological: Negative for  headaches, focal weakness or numbness.  10-point ROS otherwise negative.  ____________________________________________   PHYSICAL EXAM:  VITAL SIGNS: ED Triage Vitals  Enc Vitals Group     BP 05/18/16 0221 101/90     Pulse Rate 05/18/16 0216 86     Resp 05/18/16 0216 16     Temp 05/18/16 0216 99.5 F (37.5  C)     Temp Source 05/18/16 0216 Oral     SpO2 05/18/16 0216 99 %     Weight 05/18/16 0214 123 lb (55.8 kg)     Height --      Head Circumference --      Peak Flow --      Pain Score --      Pain Loc --      Pain Edu? --      Excl. in GC? --     Constitutional: Alert, grumbling. Frail appearing and in no acute distress. Eyes: Conjunctivae are normal. PERRL. EOMI. Head: Atraumatic. Nose: No congestion/rhinnorhea. Mouth/Throat: Mucous membranes are moist.  Oropharynx non-erythematous. Neck: No stridor.   Cardiovascular: Normal rate, regular rhythm. Grossly normal heart sounds.  Good peripheral circulation. Respiratory: Normal respiratory effort.  No retractions. Lungs CTAB. Gastrointestinal: G-tube dislodged. Soft and nontender. No distention. No abdominal bruits. No CVA tenderness. Musculoskeletal: No lower extremity tenderness nor edema.  No joint effusions. Neurologic:  Normal speech and language. No gross focal neurologic deficits are appreciated. MAEx4. Skin:  Skin is warm, dry and intact. No rash noted. Psychiatric: Mood and affect are normal. Speech and behavior are normal.  ____________________________________________   LABS (all labs ordered are listed, but only abnormal results are displayed)  Labs Reviewed - No data to display ____________________________________________  EKG  None ____________________________________________  RADIOLOGY  KUB (viewed by me, interpreted per Dr. Andria Meuse): Contrast material is in the stomach consistent with placement of the  gastrostomy tube tip within the stomach.   ____________________________________________   PROCEDURES  Procedure(s) performed:   Gastrostomy tube replacement Performed by: Irean Hong Consent: Verbal consent obtained. Risks and benefits: risks, benefits and alternatives were discussed Required items: required blood products, implants, devices, and special equipment available Patient identity  confirmed: hospital-assigned identification number Time out: Immediately prior to procedure a "time out" was called to verify the correct patient, procedure, equipment, support staff and site/side marked as required. Preparation: Patient was prepped and draped in the usual sterile fashion. Topical Urojet applied for comfort.  Patient tolerance: Patient tolerated the procedure well with no immediate complications.  Comments: 14 french Gastrostomy tube placed without difficulty  Procedures  Critical Care performed: No  ____________________________________________   INITIAL IMPRESSION / ASSESSMENT AND PLAN / ED COURSE  Pertinent labs & imaging results that were available during my care of the patient were reviewed by me and considered in my medical decision making (see chart for details).  71 year old female who presents with G-tube dislodgment. Dislodged G-tube was a 9 [place]; in October she was replaced with 14 [place]. 45 French attempted unsuccessfully so patient was replaced with 14 [place] G-tube.  Clinical Course as of May 18 354  Thu May 18, 2016  8413 Confirmed placement of G-tube on KUB. Patient will be discharged back to skilled nursing facility.   [JS]    Clinical Course User Index [JS] Irean Hong, MD     ____________________________________________   FINAL CLINICAL IMPRESSION(S) / ED DIAGNOSES  Final diagnoses:  Gastrojejunostomy tube dislodgement (HCC)      NEW MEDICATIONS STARTED DURING THIS VISIT:  New Prescriptions   No medications on file     Note:  This document was prepared using Dragon voice recognition software and may include unintentional dictation errors.    Irean Hong, MD 05/18/16 [phone]

## 2016-05-18 NOTE — Discharge Instructions (Signed)
You have a 14-G feeding tube in place. Return to the ER for worsening symptoms, persistent vomiting, difficulty breathing or other concerns.

## 2016-05-18 NOTE — ED Notes (Addendum)
Pt currently sleeping, both hands are under pt's head while she is sleeping on her side. Equal chest rise and fall, side rails up for pt safety. Warm blanket applied to pt.

## 2016-05-18 NOTE — ED Notes (Signed)
Pt is currently sleeping, both hands are under pt's head while she is sleeping on her side. Equal chest rise and fall, side rails up for pt safety.

## 2016-05-18 NOTE — ED Triage Notes (Signed)
Pt arrived to ED from [facility] after pulling her G-Tube out. Pt is baseline, per EMS and facility. G-Tube size 16.

## 2016-05-18 NOTE — ED Notes (Signed)
Pt currently sleeping, in NAD at this time. Returning facility has been notified of pt coming back by EMS.

## 2018-03-25 ENCOUNTER — Non-Acute Institutional Stay: Payer: Medicare Other | Admitting: Nurse Practitioner

## 2018-03-25 ENCOUNTER — Encounter: Payer: Self-pay | Admitting: Nurse Practitioner

## 2018-03-25 VITALS — BP 118/72 | HR 94 | Temp 98.4°F | Resp 20 | Wt 142.0 lb

## 2018-03-25 DIAGNOSIS — R509 Fever, unspecified: Secondary | ICD-10-CM | POA: Insufficient documentation

## 2018-03-25 DIAGNOSIS — R413 Other amnesia: Secondary | ICD-10-CM

## 2018-03-25 DIAGNOSIS — Z515 Encounter for palliative care: Secondary | ICD-10-CM | POA: Insufficient documentation

## 2018-03-25 DIAGNOSIS — R5081 Fever presenting with conditions classified elsewhere: Secondary | ICD-10-CM

## 2018-03-25 NOTE — Progress Notes (Signed)
[facility] Telephone: [phone] Fax: [phone]  PATIENT NAME: Dana RummageBetty Jean Cuneo DOB: 12/16/1945 MRN: [id number]  PRIMARY CARE PROVIDER:   Koren BoundMatrone, Andrew, NP  REFERRING PROVIDER: Dr Katrinka BlazingSmith; [facility] RESPONSIBLE PARTY:   Tamala BariRobin Fulmore daughter [phone]  ASSESSMENT:     I visit and observe Ms Reita ChardYellock. She did not make eye contact with verbal cues so did have her eyes opened. She was cooperative with assessment. No meaningful discussion due to severe cognitive impairment, nonverbal. Emotional support provided. DNR remains in place. I updated nursing staff. I attempted to contact her daughter for update on palliative care visit.  RECOMMENDATIONS and PLAN:   1.Palliative care encounter Z51.5; Palliative medicine team will continue to support patient, patient's family, and medical team. Visit consisted of counseling and education dealing with the complex and emotionally intense issues of symptom management and palliative care in the setting of serious and potentially life-threatening illness  2. Memory loss R41.3 secondary to dementia, appears progressive. Medical goals to continue to focus on Comfort, redirecting with supportive measures.  3. 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 comfort care measures, may consider hospice screening if daughter decides to d/c antibiotics iv therapy.  I spent 45 minutes providing this consultation,  From 10:30am to 11:15am. More than 50% of the time in this consultation was spent coordinating communication.   HISTORY OF PRESENT ILLNESS:  Dana Grimes is a 72 y.o. year old femalewith multiple medical problems including dementia, dysphagia, peripheral vascular disease, seizure disorder, hypertension, vitamin D deficiency, hypothyroidism, chronic renal failure, gastrostomy tube, failure to  thrive, anxiety.Ms Reita ChardYellock continues to reside at skilled [facility] Facility at [facility]. She remains bed bound, total ADL dependence with incontinence bowel and bladder. She does receive tube feedings through her gastrostomy tube which is essential to sustain life. 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 Initiation of antibiotic therapy cefazolin 2gm IV x 6 weeks. Medical goals DNR, do not intubate, do not hospitalized, no blood transfusion, no intubation, no hospitalization but wishes are for antibiotics, IV Therapy, diagnostic Testing, Lab. is severely cognitively and functionally impaired. At present Ms. Darnold is laying in bed, appears debilitated chronically ill. No visitors present. Palliative Care was asked to help address goals of care.   CODE STATUS: DNR  PPS: 30% HOSPICE ELIGIBILITY/DIAGNOSIS: possible with clinical presentation  PAST MEDICAL HISTORY:  Past Medical History:  Diagnosis Date  . Abnormal weight loss   . Alzheimer's dementia (HCC)   . Anxiety   . Arthritis   . Chronic kidney disease   . Dementia without behavioral disturbance (HCC)   . Gastric reflux   . Hypertension   . Major depressive disorder   . Thyroid disease    hypothyroidism  . Unspecified mood (affective) disorder (HCC)   . Vitamin D deficiency     SOCIAL HX:  Social History   Tobacco Use  . Smoking status: Never Smoker  . Smokeless tobacco: Never Used  Substance Use Topics  . Alcohol use: No    ALLERGIES: No Known Allergies   PERTINENT MEDICATIONS:  Outpatient Encounter Medications as of 03/25/2018  Medication Sig  . aspirin 81 MG chewable tablet Chew 81 mg by mouth daily.  . cholecalciferol (VITAMIN D) 1000 UNITS tablet Take 1,000 Units by mouth every morning.  .Marland Kitchen  levothyroxine (SYNTHROID, LEVOTHROID) 150 MCG tablet Take 1 tablet (150 mcg total) by mouth every morning.  Marland Kitchen. LORazepam  (ATIVAN) 0.5 MG tablet Take 1 tablet (0.5 mg total) by mouth 2 (two) times daily. Hold for sedation.  Marland Kitchen. LORazepam (ATIVAN) 0.5 MG tablet Take 1 tablet (0.5 mg total) by mouth daily as needed for anxiety (at lunctime if needed).  . Neomycin-Bacitracin-Polymyxin (TRIPLE ANTIBIOTIC) [phone] OINT Apply daily to gastrostomy tube site for 7 days  . Nutritional Supplements (FEEDING SUPPLEMENT, OSMOLITE 1.5 CAL,) LIQD Place 1,000 mLs into feeding tube daily.  . polyethylene glycol powder (GLYCOLAX/MIRALAX) powder Take 17 g by mouth every Monday, Wednesday, and Friday. *Mixed in 8 oz of water/beverage and drink daily on M-W-F   No facility-administered encounter medications on file as of 03/25/2018.     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

## 2018-05-07 ENCOUNTER — Non-Acute Institutional Stay: Payer: Medicare Other | Admitting: Nurse Practitioner

## 2018-05-07 ENCOUNTER — Encounter: Payer: Self-pay | Admitting: Nurse Practitioner

## 2018-05-07 VITALS — BP 106/69 | HR 87 | Temp 98.5°F | Resp 22 | Ht 64.0 in | Wt 141.0 lb

## 2018-05-07 DIAGNOSIS — Z515 Encounter for palliative care: Secondary | ICD-10-CM

## 2018-05-07 DIAGNOSIS — R5381 Other malaise: Secondary | ICD-10-CM

## 2018-05-07 DIAGNOSIS — R413 Other amnesia: Secondary | ICD-10-CM

## 2018-05-07 NOTE — Progress Notes (Signed)
[facility] Telephone: [phone]) [phone] Fax: [phone]  PATIENT NAME: Dana RummageBetty Jean Heritage DOB: 12/31/1945 MRN: [id number]  PRIMARY CARE PROVIDER:   Dr Jamas LavSmith REFERRING PROVIDER:  Dr [facility] RESPONSIBLE PARTY:   Georgian CoRobin Fillmore daughter [phone]  RECOMMENDATIONS and PLAN:  1. Palliative care encounter Z51.5; Palliative medicine team will continue to support patient, patient's family, and medical team. Visit consisted of counseling and education dealing with the complex and emotionally intense issues of symptom management and palliative care in the setting of serious and potentially life-threatening illness  Medical goals of care DNR, do not intubate, do not hospitalize, no blood transfusion but wishes are for antibiotics, diagnostic testing, IV hydration, lab tests, tube feedings  2. Memory loss R41.3 secondary to dementia. Continue with supportive measures as chronic disease remains Progressive.  3. Debility R53.81 secondary to dementia onset CVA progressive, encourage passive rom; encourage to transfer to geri-chair.   ASSESSMENT:     I visited and observed Ms. Goodlin. She did not make eye contact with verbal cues. She was cooperative with assessment. No meaningful discussion due to severe cognitive impairment. DNR remains in place. Medical goals have been to focus more on comfort.  Facility 05/25/2016 Palliative care 2 / 1 / 2019 Palliative care 8 / 10 / 2017 to 9 / 1 / 2017 Palliative care 10 / 27 / 2017 to 2 / 20 / 2018 Hospice 2 / 22 / 2018 to 1 / 10 / 2019  BMI 24.2 11 / 7 / 2019 weight 135.5 lbs 12 / 4 / 2019 weight 142.0 lbs 1 / 8 / 2020 weight 141.0 lbs  I spent 45 minutes providing this consultation,  From 3:00pm to 3:34pm. More than 50% of the time in this consultation was spent coordinating communication.   HISTORY OF PRESENT ILLNESS:  Dana Grimes is a 73 y.o. year old female with multiple medical problems including  Advanced dementia, dysphagia, peripheral vascular disease, seizure disorder, anemia, hyperlipidemia, hypertension, chronic renal failure, vitamin D deficiency, hypothyroidism, gastrostomy tube for tube feeding essential to sustain life, anxiety, depression. Ms. Cheri GuppyYelling continues to reside at skilled [facility]. She remains functionally quadriplegic, bed bath [place] lift to a recliner. She is total ADL dependents with incontinence of bowel and bladder. She is fed by tube feeding essential to sustain life. She's severely cognitively impaired, nonverbal. No recent files, wounds, infections, hospitalizations. She didn't have a PICC line that was removed on 2 / 3 / 2020. Last primary provider visit 04/18/2018 comprehensive review. Follow-up non-pressure chronic ulcer of right foot with necrosis of muscle.  I'm going peripheral vascular disease. She completed 6 weeks of antibiotic therapy and wound care team  from [facility] follows her. At present she is lying in bed. She appears debilitated, chronically ill but comfortable. No visitors present. Palliative Care was asked to help address goals of care.   CODE STATUS:  DNR  PPS: 30% HOSPICE ELIGIBILITY/DIAGNOSIS: TBD  PAST MEDICAL HISTORY:  Past Medical History:  Diagnosis Date  . Abnormal weight loss   . Alzheimer's dementia (HCC)   . Anxiety   . Arthritis   . Chronic kidney disease   . Dementia without behavioral disturbance (HCC)   . Gastric reflux   . Hypertension   . Major depressive disorder   . Thyroid disease    hypothyroidism  . Unspecified mood (affective) disorder (HCC)   . Vitamin D deficiency     SOCIAL HX:  Social History   Tobacco Use  . Smoking status: Never Smoker  . Smokeless tobacco: Never Used  Substance Use Topics  . Alcohol use: No    ALLERGIES: No Known Allergies   PERTINENT MEDICATIONS:  Outpatient Encounter Medications as of 05/07/2018  Medication Sig  .  aspirin 81 MG chewable tablet Chew 81 mg by mouth daily.  . cholecalciferol (VITAMIN D) 1000 UNITS tablet Take 1,000 Units by mouth every morning.  Marland Kitchen levothyroxine (SYNTHROID, LEVOTHROID) 150 MCG tablet Take 1 tablet (150 mcg total) by mouth every morning.  Marland Kitchen LORazepam (ATIVAN) 0.5 MG tablet Take 1 tablet (0.5 mg total) by mouth 2 (two) times daily. Hold for sedation.  Marland Kitchen LORazepam (ATIVAN) 0.5 MG tablet Take 1 tablet (0.5 mg total) by mouth daily as needed for anxiety (at lunctime if needed).  . Neomycin-Bacitracin-Polymyxin (TRIPLE ANTIBIOTIC) [phone] OINT Apply daily to gastrostomy tube site for 7 days  . Nutritional Supplements (FEEDING SUPPLEMENT, OSMOLITE 1.5 CAL,) LIQD Place 1,000 mLs into feeding tube daily.  . polyethylene glycol powder (GLYCOLAX/MIRALAX) powder Take 17 g by mouth every Monday, Wednesday, and Friday. *Mixed in 8 oz of water/beverage and drink daily on M-W-F   No facility-administered encounter medications on file as of 05/07/2018.     PHYSICAL EXAM:   General: NAD, frail appearing, chronically ill female Cardiovascular: regular rate and rhythm Pulmonary: clear ant fields Abdomen: soft, nontender, + bowel sounds GU: no suprapubic tenderness Extremities: no edema, no joint deformities Skin: no rashes Neurological: Weakness but otherwise nonfocal/functional quadriplegic  Money Mckeithan Z Leola Fiore, NP    .

## 2018-05-15 ENCOUNTER — Telehealth: Payer: Self-pay | Admitting: Nurse Practitioner

## 2018-05-15 NOTE — Telephone Encounter (Signed)
I called Georgian Co, Dana Grimes's daughter. We talked about purpose for palliative care visit. We talked about visit with Dana Grimes. Obtain consent to continue to follow with palliative care. We talked about tube feedings essential to sustain life. We talked about the gain. We talked about tube feedings overall. We talked about the last time she was under [facility] and was discharged due to stability and weight gain. We talked about new wound in the setting of peripheral vascular disease. We talked about current treatment of receiving IV antibiotics as well as wound care from on-site wound consultant. We talked about medical goals of care with focus on conservative with comfort. DNR does remain in place. We talked about role of palliative care and plan of care. We talked about at present time with her currently receiving IV antibiotic therapy in addition to wound intervention with tube feedings. Would need to wait on hospice screening until weight loss and or decision is made to stop antibiotic therapy and beginning discussions to stop tube feedings. Dana Grimes talked about the challenges with progression of chronic disease and overall decline. She talked about family dynamics that she is health care power-of-attorney with all decision making up to her. Therapeutic listening and emotional support provided. We talked about coping strategies. Discuss will continue to follow monitor with palliative care with next visit in 4 weeks if needed or sooner should she declined. Dana Grimes in agreement. Questions answered to satisfaction. Contact information  Total time spent 35 minutes  Documentation 10 minutes  Phone discussion 25 minutes

## 2018-06-25 ENCOUNTER — Encounter: Payer: Self-pay | Admitting: Nurse Practitioner

## 2018-06-25 ENCOUNTER — Non-Acute Institutional Stay: Payer: Medicare Other | Admitting: Nurse Practitioner

## 2018-06-25 ENCOUNTER — Other Ambulatory Visit: Payer: Self-pay

## 2018-06-25 VITALS — BP 114/80 | HR 95 | Temp 99.5°F | Resp 18 | Ht 64.0 in | Wt 154.2 lb

## 2018-06-25 DIAGNOSIS — R413 Other amnesia: Secondary | ICD-10-CM

## 2018-06-25 DIAGNOSIS — R5381 Other malaise: Secondary | ICD-10-CM

## 2018-06-25 DIAGNOSIS — Z515 Encounter for palliative care: Secondary | ICD-10-CM

## 2018-06-25 NOTE — Progress Notes (Signed)
Therapist, nutritional Palliative Care Consult Note Telephone: [phone]  Fax: [phone]  PATIENT NAME: Dana Grimes DOB: 1945/05/22 MRN: [id number]  PRIMARY CARE PROVIDER:   Dr Jamas Lav PROVIDER:  Dr [facility] RESPONSIBLE PARTY:     RECOMMENDATIONS and PLAN: 1. Palliative care encounter Z51.5; Palliative medicine team will continue to support patient, patient's family, and medical team. Visit consisted of counseling and education dealing with the complex and emotionally intense issues of symptom management and palliative care in the setting of serious and potentially life-threatening illness  Medical goals of care DNR, do not intubate, do not hospitalize, no blood transfusion but wishes are for antibiotics, diagnostic testing, IV hydration, lab tests, tube feedings  2. Memory loss R41.3 secondary to dementia. Continue with supportive measures as chronic disease remains Progressive.  3. Debility R53.81 secondary to dementia onset CVA progressive, encourage passive rom; encourage to transfer to geri-chair.    ASSESSMENT: I visited and observed Dana Grimes. She did not make eye contact with verbal cues. She was cooperative with assessment. No meaningful discussion due to cognitive impairment. At present time she does remain stable as she continues to gain weight with tube feedings essential to sustain life. Medical goes to continue to focus on comfort with DNR in place. I have attempted to contact her daughter Dana Grimes for update, message left. Updated nursing staff in the new changes to current goals or plan of care  11 / 7 / 2019 weight 135.5 lbs 12 / 4 / 2019 weight 142.0 lbs 1 / 8 / 2020 weight 141.0 lbs     Facility 05/25/2016 Palliative care 2 / 1 / 2019 Palliative care 8 / 10 / 2017 to 9 / 1 / 2017 Palliative care 10 / 27 / 2017 to 2 / 20 / 2018 Hospice 2 / 22 / 2018 to 1 / 10 / 2019  I spent 45 minutes providing this  consultation,  from 2:00pm to 2:45pm. More than 50% of the time in this consultation was spent coordinating communication.   HISTORY OF PRESENT ILLNESS:  Dana Grimes is a 73 y.o. year old female with multiple medical problems including Advanced dementia, dysphagia, peripheral vascular disease, seizure disorder, anemia, hyperlipidemia, hypertension, chronic renal failure, vitamin D deficiency, hypothyroidism, gastrostomy tube for tube feeding essential to sustain life, anxiety, depression. Dana Grimes continues to reside at [facility]. She does remain bed-bound and is a lift to the recliner. She is total ADL dependence with incontinence. She is fed through tube feeding through a gastrostomy tube essential to sustain life. She has had a weight gain. She is followed by wound care  at the facility for left lateral foot venous 3cm X 1.5 cm x 2 cm X 0.1 cm. Right foot first digit 2 cm x 2.2 cm x 0.2 cm. Last primary provider visit 05/24/2018 for a follow-up visit mixed hyperlipidemia controlled ongoing lipid panel improved on medical therapy. She is severely cognitively impaired and unable to verbalize her needs. No recent hospitalizations, Falls. At present she is lying in bed. She appears debilitated comfortable. No visitors present. Palliative Care was asked to help address goals of care.   CODE STATUS: DNR  PPS: 30% HOSPICE ELIGIBILITY/DIAGNOSIS: TBD  PAST MEDICAL HISTORY:  Past Medical History:  Diagnosis Date  . Abnormal weight loss   . Alzheimer's dementia (HCC)   . Anxiety   . Arthritis   . Chronic kidney disease   . Dementia without  behavioral disturbance (HCC)   . Gastric reflux   . Hypertension   . Major depressive disorder   . Thyroid disease    hypothyroidism  . Unspecified mood (affective) disorder (HCC)   . Vitamin D deficiency     SOCIAL HX:  Social History   Tobacco Use  . Smoking status: Never Smoker  . Smokeless tobacco: Never Used   Substance Use Topics  . Alcohol use: No    ALLERGIES: No Known Allergies   PERTINENT MEDICATIONS:  Outpatient Encounter Medications as of 06/25/2018  Medication Sig  . aspirin 81 MG chewable tablet Chew 81 mg by mouth daily.  . cholecalciferol (VITAMIN D) 1000 UNITS tablet Take 1,000 Units by mouth every morning.  Marland Kitchen levothyroxine (SYNTHROID, LEVOTHROID) 150 MCG tablet Take 1 tablet (150 mcg total) by mouth every morning.  Marland Kitchen LORazepam (ATIVAN) 0.5 MG tablet Take 1 tablet (0.5 mg total) by mouth 2 (two) times daily. Hold for sedation.  Marland Kitchen LORazepam (ATIVAN) 0.5 MG tablet Take 1 tablet (0.5 mg total) by mouth daily as needed for anxiety (at lunctime if needed).  . Neomycin-Bacitracin-Polymyxin (TRIPLE ANTIBIOTIC) [phone] OINT Apply daily to gastrostomy tube site for 7 days  . Nutritional Supplements (FEEDING SUPPLEMENT, OSMOLITE 1.5 CAL,) LIQD Place 1,000 mLs into feeding tube daily.  . polyethylene glycol powder (GLYCOLAX/MIRALAX) powder Take 17 g by mouth every Monday, Wednesday, and Friday. *Mixed in 8 oz of water/beverage and drink daily on M-W-F   No facility-administered encounter medications on file as of 06/25/2018.     PHYSICAL EXAM:   General: chronically ill, debilitated female, cognitively impaired Cardiovascular: regular rate and rhythm Pulmonary: clear ant fields Abdomen: soft, nontender, + bowel sounds/g-tube GU: no suprapubic tenderness Extremities: no edema, no joint deformities Skin: no rashes Neurological: Weakness but otherwise nonfocal/functional quadriplegic  Dana Grimes Prince Rome, NP

## 2018-06-26 ENCOUNTER — Other Ambulatory Visit: Payer: Self-pay

## 2018-08-07 ENCOUNTER — Encounter: Payer: Self-pay | Admitting: Nurse Practitioner

## 2018-08-07 ENCOUNTER — Other Ambulatory Visit: Payer: Self-pay

## 2018-08-07 ENCOUNTER — Non-Acute Institutional Stay: Payer: Medicare Other | Admitting: Nurse Practitioner

## 2018-08-07 VITALS — BP 114/62 | HR 85 | Temp 97.1°F | Resp 18 | Ht 64.0 in | Wt 147.1 lb

## 2018-08-07 DIAGNOSIS — R5381 Other malaise: Secondary | ICD-10-CM | POA: Insufficient documentation

## 2018-08-07 DIAGNOSIS — Z515 Encounter for palliative care: Secondary | ICD-10-CM

## 2018-08-07 NOTE — Progress Notes (Signed)
Therapist, nutritional Palliative Care Consult Note Telephone: [phone]  Fax: [phone]  PATIENT NAME: Dana Grimes DOB: December 22, 1945 MRN: [id number]  PRIMARY CARE PROVIDER:  Dr Jamas Lav PROVIDER:  Dr [facility] RESPONSIBLE PARTY:   Daughter, Retta Diones  RECOMMENDATIONS and PLAN:  1.Palliative care encounter Z51.5; Palliative medicine team will continue to support patient, patient's family, and medical team. Visit consisted of counseling and education dealing with the complex and emotionally intense issues of symptom management and palliative care in the setting of serious and potentially life-threatening illness  Medical goals of care DNR, do not intubate, do not hospitalize, no blood transfusion but wishes are for antibiotics, diagnostic testing, IV hydration, lab tests, tube feedings  2.Debility R53.81 secondary todementia  progressive, encourage passive rom; encourage to transfer to geri-chair.  ASSESSMENT:    I visit and observe Dana Grimes. No meaningful discussion due to severe cognitive impairment. She did not make eye contact the verbal cues. She was cooperative with assessment. Emotional support provided. I have attempted to contact her daughter, Dana Grimes. Will continue for further discussion of goals of care. She does remain a DNR. I have updated nursing staff.  4 / 30 / 2020 wbc 7.4, hemoglobin 9.6, hemocrit 28.6, platelets 389, sodium 141, potassium 5.0, chloride 97, Co2 21, calcium 9.4, bun 33.5, creatinine 1.16, glucose 137, total protein 7.6, albumin 3.6  4 / 15 / 2020 chest x-ray without any effusion, infiltrate with no acute findings, no aspiration pneumonia noted  4 / 16 / 2020 KUB normal  4 / 21 / 2020 bilateral arterial legs noted mild stenosis bilaterally with no occlusion or significant stenosis  11 / 7 / 2019 weight 135.5 lbs 12 / 4 / 2019 weight 142.0 lbs 1 / 8 / 2020 weight 141.0 lbs    4 / 23 /  2020 weight 147 .1 lbs (edema)  Facility 05/25/2016 Palliative care 2 / 1 / 2019 Palliative care 8 / 10 / 2017 to 9 / 1 / 2017 Palliative care 10 / 27 / 2017 to 2 / 20 / 2018 Hospice 2 / 22 / 2018 to 1 / 10 / 2019  I spent 45 minutes providing this consultation,  from 9:45am to 10:15am. More than 50% of the time in this consultation was spent coordinating communication.   HISTORY OF PRESENT ILLNESS:  Dana Grimes is a 73 y.o. year old female with multiple medical problems including Advanced dementia, dysphagia, peripheral vascular disease, seizure disorder, anemia, hyperlipidemia, hypertension, chronic renal failure, vitamin D deficiency, hypothyroidism, gastrostomy tube for tube feeding essential to sustain life, anxiety, depression. Dana. Grimes continues to reside at [facility]. She remains bed-bound, total ADL dependents functionally quadriplegic with incontinence bowel and bladder. She does received two feedings essential to sustain life. No recent hospitalizations, Falls. 07/23/2018 elevated temperature, UA/C&S, CBC with cmet, chest x-ray and x-ray right foot completed. Last primary provider Note 4 / 16 / 2020 for acute bacterial infection with improved fever. She did vomit with KUB ordered. She does have a gastrostomy tube and water flushes were increased. She did receive IV therapy with Rocephin, Flagyl in addition to Tylenol for fever management. She is followed by wound care at the facility with last date of service 5 / 4 / 2020 for right foot area 2.4 cm x length to cm x width 1.2 cm x depth 0.2 cm and right foot lateral area 2.0 CM, length 2.0 cm x width 1.0  cm x depth of 0.1 cm. At present she is lying in bed. She appears debilitated, severely cognitively impaired but comfortable. No visitors present. Palliative Care was asked to help to continue to address goals of care.   CODE STATUS: DNR  PPS: 30% HOSPICE ELIGIBILITY/DIAGNOSIS: TBD  PAST MEDICAL  HISTORY:  Past Medical History:  Diagnosis Date  . Abnormal weight loss   . Alzheimer's dementia (HCC)   . Anxiety   . Arthritis   . Chronic kidney disease   . Dementia without behavioral disturbance (HCC)   . Gastric reflux   . Hypertension   . Major depressive disorder   . Thyroid disease    hypothyroidism  . Unspecified mood (affective) disorder (HCC)   . Vitamin D deficiency     SOCIAL HX:  Social History   Tobacco Use  . Smoking status: Never Smoker  . Smokeless tobacco: Never Used  Substance Use Topics  . Alcohol use: No    ALLERGIES: No Known Allergies   PERTINENT MEDICATIONS:  Outpatient Encounter Medications as of 08/07/2018  Medication Sig  . aspirin 81 MG chewable tablet Chew 81 mg by mouth daily.  . cholecalciferol (VITAMIN D) 1000 UNITS tablet Take 1,000 Units by mouth every morning.  Marland Kitchen. levothyroxine (SYNTHROID, LEVOTHROID) 150 MCG tablet Take 1 tablet (150 mcg total) by mouth every morning.  Marland Kitchen. LORazepam (ATIVAN) 0.5 MG tablet Take 1 tablet (0.5 mg total) by mouth 2 (two) times daily. Hold for sedation.  Marland Kitchen. LORazepam (ATIVAN) 0.5 MG tablet Take 1 tablet (0.5 mg total) by mouth daily as needed for anxiety (at lunctime if needed).  . Neomycin-Bacitracin-Polymyxin (TRIPLE ANTIBIOTIC) [phone] OINT Apply daily to gastrostomy tube site for 7 days  . Nutritional Supplements (FEEDING SUPPLEMENT, OSMOLITE 1.5 CAL,) LIQD Place 1,000 mLs into feeding tube daily.  . polyethylene glycol powder (GLYCOLAX/MIRALAX) powder Take 17 g by mouth every Monday, Wednesday, and Friday. *Mixed in 8 oz of water/beverage and drink daily on M-W-F   No facility-administered encounter medications on file as of 08/07/2018.     PHYSICAL EXAM:   General: chronically ill, functionally and cognitively impaired chronically ill, edematous female Cardiovascular: regular rate and rhythm Pulmonary: clear ant fields Abdomen: distended, nontender, + bowel sounds GU: no suprapubic tenderness  Extremities: contracted, edematous Skin: no rashes Neurological: functionally quadriplegic  Dana Bartell Prince RomeZ Cola Highfill, NP

## 2018-08-08 ENCOUNTER — Telehealth: Payer: Self-pay | Admitting: Nurse Practitioner

## 2018-08-08 NOTE — Telephone Encounter (Signed)
I called Tamala Bari, Ms. Shaikh's daughter. We talked about her purpose of palliative care visit. We talked about palliative care visit with Ms. Brundage. We talked about recent changes with acute illness. We talked about receiving IV fluids. We talked about 6 lb weight gain likely secondary to chronic kidney disease and concern worsening. We talked about her overall state of health. We talked about disease progression of chronic kidney disease, peripheral vascular disease with wounds and end stage dementia with dysphasia requiring to feeding essential to sustain life. We talked about medical goals of care. We talked about wishes for focus on Com[place]. DNR does remain in place. We talked about role palliative care plan of care. We talked about the last time Ms. Carriero was under [employer] Zella Ball and I talked about option of hospice screening in [place] in agreement. I discussed Adebimpe Olofintugi, [employer] NP and she was in agreement for hospice screening and will send order.  Total time spent 35 minutes Documentation 10 minutes Phone discussion 25 minutes

## 2018-12-30 ENCOUNTER — Other Ambulatory Visit: Payer: Self-pay

## 2018-12-30 ENCOUNTER — Encounter (INDEPENDENT_AMBULATORY_CARE_PROVIDER_SITE_OTHER): Payer: Self-pay | Admitting: Vascular Surgery

## 2018-12-30 ENCOUNTER — Encounter (INDEPENDENT_AMBULATORY_CARE_PROVIDER_SITE_OTHER): Payer: Self-pay

## 2018-12-30 ENCOUNTER — Ambulatory Visit (INDEPENDENT_AMBULATORY_CARE_PROVIDER_SITE_OTHER): Payer: Medicare Other | Admitting: Vascular Surgery

## 2018-12-30 VITALS — BP 174/69 | HR 90 | Resp 16

## 2018-12-30 DIAGNOSIS — M2450 Contracture, unspecified joint: Secondary | ICD-10-CM

## 2018-12-30 DIAGNOSIS — I70269 Atherosclerosis of native arteries of extremities with gangrene, unspecified extremity: Secondary | ICD-10-CM

## 2018-12-30 DIAGNOSIS — I70261 Atherosclerosis of native arteries of extremities with gangrene, right leg: Secondary | ICD-10-CM | POA: Diagnosis not present

## 2018-12-30 DIAGNOSIS — I10 Essential (primary) hypertension: Secondary | ICD-10-CM

## 2018-12-30 DIAGNOSIS — F0391 Unspecified dementia with behavioral disturbance: Secondary | ICD-10-CM | POA: Diagnosis not present

## 2018-12-30 NOTE — Progress Notes (Signed)
MRN : [id number]  Dana Grimes is a 73 y.o. (09/29/1945) female who presents with chief complaint of  Chief Complaint  Patient presents with  . New Patient (Initial Visit)    ref Ramond MarrowVale for rt distal 2,3,4 gangrene toes  .  History of Present Illness:   The patient is seen for evaluation of gangrene of the lower extremities and diminished pulses associated with multiple ulceration of the foot.  The patient is not communicative.    No prior interventions or surgeries.  No history of back problems or DJD of the lumbar sacral spine.   The patient denies amaurosis fugax or recent TIA symptoms. There are no recent neurological changes noted. The patient denies history of DVT, PE or superficial thrombophlebitis. The patient denies recent episodes of angina or shortness of breath.   Current Meds  Medication Sig  . acetaminophen (TYLENOL) 325 MG tablet Take 650 mg by mouth every 6 (six) hours as needed.  . APTIOM 400 MG TABS   . divalproex (DEPAKOTE SPRINKLE) 125 MG capsule   . levothyroxine (SYNTHROID) 112 MCG tablet   . Nutritional Supplements (FEEDING SUPPLEMENT, OSMOLITE 1.5 CAL,) LIQD Place 1,000 mLs into feeding tube daily.  . polyethylene glycol powder (GLYCOLAX/MIRALAX) powder Take 17 g by mouth every Monday, Wednesday, and Friday. *Mixed in 8 oz of water/beverage and drink daily on M-W-F  . senna (SENOKOT) 8.6 MG tablet Take 1 tablet by mouth 2 (two) times daily.  Marland Kitchen. valproic acid (DEPAKENE) 250 MG/5ML SOLN solution     Past Medical History:  Diagnosis Date  . Abnormal weight loss   . Alzheimer's dementia (HCC)   . Anxiety   . Arthritis   . Chronic kidney disease   . Dementia without behavioral disturbance (HCC)   . Gastric reflux   . Hypertension   . Major depressive disorder   . Thyroid disease    hypothyroidism  . Unspecified mood (affective) disorder (HCC)   . Vitamin D deficiency     Past Surgical History:  Procedure Laterality Date  . ABDOMINAL  HYSTERECTOMY    . PEG PLACEMENT N/A 06/27/2014   Procedure: PERCUTANEOUS ENDOSCOPIC GASTROSTOMY (PEG) PLACEMENT;  Surgeon: Malissa HippoNajeeb U Rehman, MD;  [facility];  Service: Endoscopy;  Laterality: N/A;    Social History Social History   Tobacco Use  . Smoking status: Never Smoker  . Smokeless tobacco: Never Used  Substance Use Topics  . Alcohol use: No  . Drug use: Not on file    Family History No family history on file.  No family history of bleeding/clotting disorders, porphyria or autoimmune disease   No Known Allergies   REVIEW OF SYSTEMS (Negative unless checked)  Constitutional: [] Weight loss  [] Fever  [] Chills Cardiac: [] Chest pain   [] Chest pressure   [] Palpitations   [] Shortness of breath when laying flat   [] Shortness of breath with exertion. Vascular:  [] Pain in legs with walking   [] Pain in legs at rest  [] History of DVT   [] Phlebitis   [] Swelling in legs   [] Varicose veins   [x] Non-healing ulcers Pulmonary:   [] Uses home oxygen   [] Productive cough   [] Hemoptysis   [] Wheeze  [] COPD   [] Asthma Neurologic:  [] Dizziness   [] Seizures   [] History of stroke   [] History of TIA  [] Aphasia   [] Vissual changes   [] Weakness or numbness in arm   [] Weakness or numbness in leg Musculoskeletal:   [] Joint swelling   [] Joint pain   [] Low back  pain Hematologic:  [] Easy bruising  [] Easy bleeding   [] Hypercoagulable state   [] Anemic Gastrointestinal:  [] Diarrhea   [] Vomiting  [] Gastroesophageal reflux/heartburn   [] Difficulty swallowing. Genitourinary:  [] Chronic kidney disease   [] Difficult urination  [] Frequent urination   [] Blood in urine Skin:  [] Rashes   [] Ulcers  Psychological:  [] History of anxiety   []  History of major depression.  Physical Examination  Vitals:   12/30/18 1520  BP: (!) 174/69  Pulse: 90  Resp: 16   There is no height or weight on file to calculate BMI. Gen: WD/WN, NAD seen in a gurney  Head: [place]/AT, No temporalis wasting.  Ear/Nose/Throat:  Hearing grossly intact, nares w/o erythema or drainage, poor dentition Eyes: PER, EOMI, sclera nonicteric.  Neck: Supple, no masses.  No bruit or JVD.  Pulmonary:  Good air movement, clear to auscultation bilaterally, no use of accessory muscles.  Cardiac: RRR, normal S1, S2, no Murmurs. Vascular: gangrene of the entire right forefoot associated with ulceration of the ankle Vessel Right Left  PT Not Palpable Not Palpable  DP Not Palpable Not Palpable  Gastrointestinal: soft G-tube present, non-distended. No guarding/no peritoneal signs.  Musculoskeletal: Profound deformity with atrophy.  Neurologic: Nonreactive to stimuli Psychiatric: unresponsive to verbal inquiry. Dermatologic:  ulcers noted right foot.  No changes consistent with cellulitis. Lymph : No Cervical lymphadenopathy, no lichenification or skin changes of chronic lymphedema.  CBC Lab Results  Component Value Date   WBC 5.6 07/02/2014   HGB 10.3 (L) 07/02/2014   HCT 31.9 (L) 07/02/2014   MCV 79.0 07/02/2014   PLT 262 07/02/2014    BMET    Component Value Date/Time   NA 141 07/01/2014 0548   NA 138 09/10/2012 1943   K 4.8 07/01/2014 0548   K 3.3 (L) 09/10/2012 1943   CL 103 07/01/2014 0548   CL 104 09/10/2012 1943   CO2 31 07/01/2014 0548   CO2 24 09/10/2012 1943   GLUCOSE 99 07/01/2014 0548   GLUCOSE 118 (H) 09/10/2012 1943   BUN 9 07/01/2014 0548   BUN 11 09/10/2012 1943   CREATININE 0.71 07/01/2014 0548   CREATININE 1.18 09/10/2012 1943   CALCIUM 9.4 07/01/2014 0548   CALCIUM 9.2 09/10/2012 1943   GFRNONAA 86 (L) 07/01/2014 0548   GFRNONAA 48 (L) 09/10/2012 1943   GFRAA >90 07/01/2014 0548   GFRAA 55 (L) 09/10/2012 1943   CrCl cannot be calculated (Patient's most recent lab result is older than the maximum 21 days allowed.).  COAG Lab Results  Component Value Date   INR 1.29 06/27/2014    Radiology No results found.   Assessment/Plan 1. 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  There is no possibility of reconstruction or limb salvage.  I have discussed this with the daughter and she will talk with the family and get back to me if they wish to proceed with surgery   A total of 70 minutes was spent with this patient and greater than 50% was spent in counseling and coordination of care with the patient.  Discussion included the treatment options for vascular disease including indications for surgery and intervention.  Also discussed is the appropriate timing of treatment.  In addition medical therapy was discussed.  2. Flexion contracture joint of multiple sites See #1  3. Dementia with behavioral disturbance, unspecified dementia type (HCC) End stage no intervention indicated  4. Hypertension, benign Continue meds as ordered  Hortencia Pilar, MD  12/30/2018 5:11 PM

## 2019-01-02 ENCOUNTER — Other Ambulatory Visit: Payer: Self-pay

## 2019-01-02 ENCOUNTER — Inpatient Hospital Stay
Admission: EM | Admit: 2019-01-02 | Discharge: 2019-01-03 | DRG: 919 | Disposition: A | Discharge status: Skilled Nursing Facility | Attending: Internal Medicine | Admitting: Internal Medicine

## 2019-01-02 DIAGNOSIS — U071 COVID-19: Secondary | ICD-10-CM | POA: Diagnosis present

## 2019-01-02 DIAGNOSIS — G309 Alzheimer's disease, unspecified: Secondary | ICD-10-CM | POA: Diagnosis present

## 2019-01-02 DIAGNOSIS — Z20828 Contact with and (suspected) exposure to other viral communicable diseases: Secondary | ICD-10-CM | POA: Diagnosis present

## 2019-01-02 DIAGNOSIS — T85528A Displacement of other gastrointestinal prosthetic devices, implants and grafts, initial encounter: Secondary | ICD-10-CM | POA: Diagnosis not present

## 2019-01-02 DIAGNOSIS — Z79899 Other long term (current) drug therapy: Secondary | ICD-10-CM

## 2019-01-02 DIAGNOSIS — R6251 Failure to thrive (child): Secondary | ICD-10-CM

## 2019-01-02 DIAGNOSIS — F419 Anxiety disorder, unspecified: Secondary | ICD-10-CM | POA: Diagnosis present

## 2019-01-02 DIAGNOSIS — F329 Major depressive disorder, single episode, unspecified: Secondary | ICD-10-CM | POA: Diagnosis present

## 2019-01-02 DIAGNOSIS — E039 Hypothyroidism, unspecified: Secondary | ICD-10-CM | POA: Diagnosis present

## 2019-01-02 DIAGNOSIS — Z90710 Acquired absence of both cervix and uterus: Secondary | ICD-10-CM

## 2019-01-02 DIAGNOSIS — L8990 Pressure ulcer of unspecified site, unspecified stage: Secondary | ICD-10-CM | POA: Insufficient documentation

## 2019-01-02 DIAGNOSIS — F0280 Dementia in other diseases classified elsewhere without behavioral disturbance: Secondary | ICD-10-CM | POA: Diagnosis present

## 2019-01-02 DIAGNOSIS — N179 Acute kidney failure, unspecified: Secondary | ICD-10-CM | POA: Diagnosis present

## 2019-01-02 DIAGNOSIS — E860 Dehydration: Secondary | ICD-10-CM | POA: Diagnosis present

## 2019-01-02 DIAGNOSIS — K9423 Gastrostomy malfunction: Secondary | ICD-10-CM

## 2019-01-02 DIAGNOSIS — F39 Unspecified mood [affective] disorder: Secondary | ICD-10-CM | POA: Diagnosis present

## 2019-01-02 DIAGNOSIS — I129 Hypertensive chronic kidney disease with stage 1 through stage 4 chronic kidney disease, or unspecified chronic kidney disease: Secondary | ICD-10-CM | POA: Diagnosis present

## 2019-01-02 DIAGNOSIS — Z431 Encounter for attention to gastrostomy: Secondary | ICD-10-CM

## 2019-01-02 DIAGNOSIS — Z66 Do not resuscitate: Secondary | ICD-10-CM | POA: Diagnosis present

## 2019-01-02 DIAGNOSIS — N189 Chronic kidney disease, unspecified: Secondary | ICD-10-CM | POA: Diagnosis present

## 2019-01-02 DIAGNOSIS — Z7982 Long term (current) use of aspirin: Secondary | ICD-10-CM

## 2019-01-02 DIAGNOSIS — Z79890 Hormone replacement therapy (postmenopausal): Secondary | ICD-10-CM

## 2019-01-02 NOTE — ED Provider Notes (Signed)
[facility] Emergency Department Provider Note   First MD Initiated Contact with Patient 01/02/19 2346     (approximate)  I have reviewed the triage vital signs and the nursing notes.  Level 5 caveat history review of system limited secondary to Alzheimer's dementia. HISTORY  Chief Complaint g tube dislodged    HPI Dana RummageBetty Jean Grimes is a 73 y.o. female with below list of previous medical conditions including indwelling G-tube for many years presents to the emergency department via EMS secondary to G-tube dislodgment.  EMS states that the patient "ripped out her G-tube.  They were not notified as to what time this may have occurred       Past Medical History:  Diagnosis Date  . Abnormal weight loss   . Alzheimer's dementia (HCC)   . Anxiety   . Arthritis   . Chronic kidney disease   . Dementia without behavioral disturbance (HCC)   . Gastric reflux   . Hypertension   . Major depressive disorder   . Thyroid disease    hypothyroidism  . Unspecified mood (affective) disorder (HCC)   . Vitamin D deficiency     Patient Active Problem List   Diagnosis Date Noted  . Debility 08/07/2018  . Memory deficit 05/07/2018  . General body deterioration 05/07/2018  . Palliative care encounter 03/25/2018  . Memory loss 03/25/2018  . Fever 03/25/2018  . Protein-calorie malnutrition, severe (HCC) 06/27/2014  . Dehydration 06/26/2014  . Failure to thrive in adult 06/26/2014  . Hypernatremia 06/26/2014  . Dementia (HCC) 06/26/2014  . Hypothyroidism 06/26/2014  . Acute constipation 03/25/2014  . Hemorrhoids 03/25/2014  . Insomnia 03/25/2014  . Depression 11/21/2013  . Vitamin D deficiency 11/21/2013  . Dermatitis 11/07/2013  . Loss of appetite 11/07/2013  . Mixed hyperlipidemia 03/06/2007  . Hypertension, benign 01/09/2006    Past Surgical History:  Procedure Laterality Date  . ABDOMINAL HYSTERECTOMY    . PEG PLACEMENT N/A 06/27/2014   Procedure:  PERCUTANEOUS ENDOSCOPIC GASTROSTOMY (PEG) PLACEMENT;  Surgeon: Malissa HippoNajeeb U Rehman, MD;  [facility];  Service: Endoscopy;  Laterality: N/A;    Prior to Admission medications   Medication Sig Start Date End Date Taking? Authorizing Provider  acetaminophen (TYLENOL) 325 MG tablet Take 650 mg by mouth every 6 (six) hours as needed.    [provider]  APTIOM 400 MG TABS  12/15/18   [provider]  aspirin 81 MG chewable tablet Chew 81 mg by mouth daily.    [provider]  cholecalciferol (VITAMIN D) 1000 UNITS tablet Take 1,000 Units by mouth every morning.    [provider]  divalproex (DEPAKOTE SPRINKLE) 125 MG capsule  08/12/18   [provider]  levothyroxine (SYNTHROID) 112 MCG tablet  11/25/18   [provider]  levothyroxine (SYNTHROID, LEVOTHROID) 150 MCG tablet Take 1 tablet (150 mcg total) by mouth every morning. Patient not taking: Reported on 12/30/2018 07/02/14   Standley BrookingGoodrich, Daniel P, MD  LORazepam (ATIVAN) 0.5 MG tablet Take 1 tablet (0.5 mg total) by mouth 2 (two) times daily. Hold for sedation. Patient not taking: Reported on 12/30/2018 07/02/14   Standley BrookingGoodrich, Daniel P, MD  LORazepam (ATIVAN) 0.5 MG tablet Take 1 tablet (0.5 mg total) by mouth daily as needed for anxiety (at lunctime if needed). Patient not taking: Reported on 12/30/2018 07/02/14   Standley BrookingGoodrich, Daniel P, MD  Neomycin-Bacitracin-Polymyxin (TRIPLE ANTIBIOTIC) [phone] OINT Apply daily to gastrostomy tube site for 7 days Patient not taking: Reported  on 12/30/2018 07/02/14   Samuella Cota, MD  Nutritional Supplements (FEEDING SUPPLEMENT, OSMOLITE 1.5 CAL,) LIQD Place 1,000 mLs into feeding tube daily. 07/02/14   Samuella Cota, MD  polyethylene glycol powder [facility]) powder Take 17 g by mouth every Monday, Wednesday, and Friday. *Mixed in 8 oz of water/beverage and drink daily on M-W-F    [provider]  senna (SENOKOT) 8.6 MG tablet Take 1  tablet by mouth 2 (two) times daily.    [provider]  valproic acid (DEPAKENE) 250 MG/5ML SOLN solution  12/18/18   [provider]    Allergies Patient has no known allergies.  No family history on file.  Social History Social History   Tobacco Use  . Smoking status: Never Smoker  . Smokeless tobacco: Never Used  Substance Use Topics  . Alcohol use: No  . Drug use: Not on file    Review of Systems Constitutional: No fever/chills Eyes: No visual changes. ENT: No sore throat. Cardiovascular: Denies chest pain. Respiratory: Denies shortness of breath. Gastrointestinal: No abdominal pain.  No nausea, no vomiting.  No diarrhea.  No constipation. Genitourinary: Negative for dysuria. Musculoskeletal: Negative for neck pain.  Negative for back pain. Integumentary: Negative for rash. Neurological: Negative for headaches, focal weakness or numbness.   ____________________________________________   PHYSICAL EXAM:  VITAL SIGNS: ED Triage Vitals  Enc Vitals Group     BP 01/02/19 2336 (!) 154/95     Pulse Rate 01/02/19 2336 87     Resp 01/02/19 2336 18     Temp 01/02/19 2336 99.3 F (37.4 C)     Temp Source 01/02/19 2336 Axillary     SpO2 01/02/19 2336 95 %     Weight 01/02/19 2337 68 kg (150 lb)     Height 01/02/19 2337 1.727 m (5\' 8" )     Head Circumference --      Peak Flow --      Pain Score --      Pain Loc --      Pain Edu? --      Excl. in [place]? --     Constitutional: Alert, nonverbal Eyes: Conjunctivae are normal.  Mouth/Throat: Mucous membranes are moist. Neck: No stridor.  No meningeal signs.   Cardiovascular: Normal rate, regular rhythm. Good peripheral circulation. Grossly normal heart sounds. Respiratory: Normal respiratory effort.  No retractions. Gastrointestinal: Soft and nontender. No distention.   Musculoskeletal: No lower extremity tenderness nor edema. No gross deformities of extremities. Neurologic:  Normal speech and  language. No gross focal neurologic deficits are appreciated.  Skin:  Skin is warm, dry and intact. Psychiatric: Mood and affect are normal. Speech and behavior are normal.  ____________________________________________   LABS (all labs ordered are listed, but only abnormal results are displayed)  Labs Reviewed  BASIC METABOLIC PANEL - Abnormal; Notable for the following components:      Result Value   Sodium 150 (*)    Chloride 114 (*)    Glucose, Bld 113 (*)    BUN 43 (*)    Creatinine, Ser 1.38 (*)    GFR calc non Af Amer 38 (*)    GFR calc Af Amer 44 (*)    All other components within normal limits  CBC - Abnormal; Notable for the following components:   Hemoglobin 9.6 (*)    HCT 31.7 (*)    MCH 24.4 (*)    All other components within normal limits      Procedures  ____________________________________________   INITIAL IMPRESSION / MDM / ASSESSMENT AND PLAN / ED COURSE  As part of my medical decision making, I reviewed the following data within the electronic MEDICAL RECORD NUMBER   73 year old female presenting with above-stated history and physical exam secondary to dislodged G-tube.  16 French G-tube was attempted to be replaced in the emergency department however we were unsuccessful.  Patient discussed with Dr. Anne Hahn for hospital admission for possible IR G-tube placement.  ____________________________________________  FINAL CLINICAL IMPRESSION(S) / ED DIAGNOSES  Final diagnoses:  Dislodged gastrostomy tube     MEDICATIONS GIVEN DURING THIS VISIT:  Medications  morphine 2 MG/ML injection 2 mg (2 mg Intravenous Given 01/03/19 0055)     ED Discharge Orders    None      *Please note:  Anastasia Tompson was evaluated in Emergency Department on 01/03/2019 for the symptoms described in the history of present illness. She was evaluated in the context of the global COVID-19 pandemic, which necessitated consideration that the patient might be at risk for  infection with the SARS-CoV-2 virus that causes COVID-19. Institutional protocols and algorithms that pertain to the evaluation of patients at risk for COVID-19 are in a state of rapid change based on information released by regulatory bodies including the CDC and federal and state organizations. These policies and algorithms were followed during the patient's care in the ED.  Some ED evaluations and interventions may be delayed as a result of limited staffing during the pandemic.*  Note:  This document was prepared using Dragon voice recognition software and may include unintentional dictation errors.   Darci Current, MD 01/03/19 [phone]

## 2019-01-03 ENCOUNTER — Inpatient Hospital Stay

## 2019-01-03 ENCOUNTER — Other Ambulatory Visit: Payer: Self-pay

## 2019-01-03 DIAGNOSIS — Z7982 Long term (current) use of aspirin: Secondary | ICD-10-CM | POA: Diagnosis not present

## 2019-01-03 DIAGNOSIS — F39 Unspecified mood [affective] disorder: Secondary | ICD-10-CM | POA: Diagnosis present

## 2019-01-03 DIAGNOSIS — U071 COVID-19: Secondary | ICD-10-CM | POA: Diagnosis present

## 2019-01-03 DIAGNOSIS — L8990 Pressure ulcer of unspecified site, unspecified stage: Secondary | ICD-10-CM | POA: Insufficient documentation

## 2019-01-03 DIAGNOSIS — N189 Chronic kidney disease, unspecified: Secondary | ICD-10-CM | POA: Diagnosis present

## 2019-01-03 DIAGNOSIS — E039 Hypothyroidism, unspecified: Secondary | ICD-10-CM | POA: Diagnosis present

## 2019-01-03 DIAGNOSIS — Z90710 Acquired absence of both cervix and uterus: Secondary | ICD-10-CM | POA: Diagnosis not present

## 2019-01-03 DIAGNOSIS — F329 Major depressive disorder, single episode, unspecified: Secondary | ICD-10-CM | POA: Diagnosis present

## 2019-01-03 DIAGNOSIS — Z20828 Contact with and (suspected) exposure to other viral communicable diseases: Secondary | ICD-10-CM | POA: Diagnosis present

## 2019-01-03 DIAGNOSIS — I129 Hypertensive chronic kidney disease with stage 1 through stage 4 chronic kidney disease, or unspecified chronic kidney disease: Secondary | ICD-10-CM | POA: Diagnosis present

## 2019-01-03 DIAGNOSIS — E860 Dehydration: Secondary | ICD-10-CM | POA: Diagnosis present

## 2019-01-03 DIAGNOSIS — Z79890 Hormone replacement therapy (postmenopausal): Secondary | ICD-10-CM | POA: Diagnosis not present

## 2019-01-03 DIAGNOSIS — T85528A Displacement of other gastrointestinal prosthetic devices, implants and grafts, initial encounter: Secondary | ICD-10-CM | POA: Diagnosis present

## 2019-01-03 DIAGNOSIS — F0280 Dementia in other diseases classified elsewhere without behavioral disturbance: Secondary | ICD-10-CM | POA: Diagnosis present

## 2019-01-03 DIAGNOSIS — Z431 Encounter for attention to gastrostomy: Secondary | ICD-10-CM | POA: Diagnosis not present

## 2019-01-03 DIAGNOSIS — N179 Acute kidney failure, unspecified: Secondary | ICD-10-CM | POA: Diagnosis present

## 2019-01-03 DIAGNOSIS — F419 Anxiety disorder, unspecified: Secondary | ICD-10-CM | POA: Diagnosis present

## 2019-01-03 DIAGNOSIS — Z66 Do not resuscitate: Secondary | ICD-10-CM | POA: Diagnosis present

## 2019-01-03 DIAGNOSIS — Z79899 Other long term (current) drug therapy: Secondary | ICD-10-CM | POA: Diagnosis not present

## 2019-01-03 DIAGNOSIS — G309 Alzheimer's disease, unspecified: Secondary | ICD-10-CM | POA: Diagnosis present

## 2019-01-03 LAB — CBC
HCT: 31.7 % — ABNORMAL LOW (ref 36.0–46.0)
Hemoglobin: 9.6 g/dL — ABNORMAL LOW (ref 12.0–15.0)
MCH: 24.4 pg — ABNORMAL LOW (ref 26.0–34.0)
MCHC: 30.3 g/dL (ref 30.0–36.0)
MCV: 80.7 fL (ref 80.0–100.0)
Platelets: 296 10*3/uL (ref 150–400)
RBC: 3.93 MIL/uL (ref 3.87–5.11)
RDW: 15.4 % (ref 11.5–15.5)
WBC: 9.4 10*3/uL (ref 4.0–10.5)
nRBC: 0 % (ref 0.0–0.2)

## 2019-01-03 LAB — TSH: TSH: 14.473 u[IU]/mL — ABNORMAL HIGH (ref 0.350–4.500)

## 2019-01-03 LAB — BASIC METABOLIC PANEL
Anion gap: 11 (ref 5–15)
BUN: 43 mg/dL — ABNORMAL HIGH (ref 8–23)
CO2: 25 mmol/L (ref 22–32)
Calcium: 9.4 mg/dL (ref 8.9–10.3)
Chloride: 114 mmol/L — ABNORMAL HIGH (ref 98–111)
Creatinine, Ser: 1.38 mg/dL — ABNORMAL HIGH (ref 0.44–1.00)
GFR calc Af Amer: 44 mL/min — ABNORMAL LOW (ref >60)
GFR calc non Af Amer: 38 mL/min — ABNORMAL LOW (ref >60)
Glucose, Bld: 113 mg/dL — ABNORMAL HIGH (ref 70–99)
Potassium: 4.4 mmol/L (ref 3.5–5.1)
Sodium: 150 mmol/L — ABNORMAL HIGH (ref 135–145)

## 2019-01-03 LAB — MRSA PCR SCREENING: MRSA by PCR: POSITIVE — AB

## 2019-01-03 LAB — HEMOGLOBIN A1C
Hgb A1c MFr Bld: 7.1 % — ABNORMAL HIGH (ref 4.8–5.6)
Mean Plasma Glucose: 157.07 mg/dL

## 2019-01-03 LAB — SARS CORONAVIRUS 2 (TAT 6-24 HRS): SARS Coronavirus 2: POSITIVE — AB

## 2019-01-03 MED ORDER — VALPROIC ACID 250 MG/5ML PO SOLN
250.0000 mg | Freq: Every day | ORAL | Status: DC
  Filled 2019-01-03: qty 5

## 2019-01-03 MED ORDER — ACETAMINOPHEN 325 MG PO TABS: 650.0000 mg | ORAL_TABLET | Freq: Four times a day (QID) | ORAL | Status: DC | PRN

## 2019-01-03 MED ORDER — ONDANSETRON HCL 4 MG PO TABS: 4.0000 mg | ORAL_TABLET | Freq: Four times a day (QID) | ORAL | Status: DC | PRN

## 2019-01-03 MED ORDER — ONDANSETRON HCL 4 MG/2ML IJ SOLN: 4.0000 mg | Freq: Four times a day (QID) | INTRAMUSCULAR | Status: DC | PRN

## 2019-01-03 MED ORDER — IOHEXOL 300 MG/ML  SOLN
10.0000 mL | Freq: Once | INTRAMUSCULAR | Status: AC | PRN
  Administered 2019-01-03: 10 mL

## 2019-01-03 MED ORDER — ACETAMINOPHEN 650 MG RE SUPP
650.0000 mg | Freq: Four times a day (QID) | RECTAL | Status: DC | PRN
  Administered 2019-01-03: 650 mg via RECTAL
  Filled 2019-01-03: qty 1

## 2019-01-03 MED ORDER — DEXAMETHASONE SODIUM PHOSPHATE 10 MG/ML IJ SOLN: 6.0000 mg | INTRAMUSCULAR | 0 refills | Status: DC

## 2019-01-03 MED ORDER — SODIUM CHLORIDE 0.9 % IV SOLN
INTRAVENOUS | Status: DC
  Administered 2019-01-03: 07:00:00 via INTRAVENOUS

## 2019-01-03 MED ORDER — DEXAMETHASONE 4 MG PO TABS
6.0000 mg | ORAL_TABLET | Freq: Every day | ORAL | Status: DC
  Filled 2019-01-03: qty 1.5

## 2019-01-03 MED ORDER — LEVOTHYROXINE SODIUM 112 MCG PO TABS
112.0000 ug | ORAL_TABLET | Freq: Every day | ORAL | Status: DC
  Filled 2019-01-03 (×2): qty 1

## 2019-01-03 MED ORDER — ENOXAPARIN SODIUM 60 MG/0.6ML ~~LOC~~ SOLN
1.0000 mg/kg | Freq: Once | SUBCUTANEOUS | Status: AC
  Administered 2019-01-03: 60 mg via SUBCUTANEOUS
  Filled 2019-01-03: qty 0.6

## 2019-01-03 MED ORDER — VALPROIC ACID 250 MG/5ML PO SOLN
125.0000 mg | Freq: Every day | ORAL | Status: DC
  Filled 2019-01-03: qty 5

## 2019-01-03 MED ORDER — DEXAMETHASONE SODIUM PHOSPHATE 10 MG/ML IJ SOLN
6.0000 mg | INTRAMUSCULAR | Status: DC
  Administered 2019-01-03: 6 mg via INTRAVENOUS
  Filled 2019-01-03: qty 0.6

## 2019-01-03 MED ORDER — MORPHINE SULFATE (PF) 2 MG/ML IV SOLN
2.0000 mg | Freq: Once | INTRAVENOUS | Status: AC
  Administered 2019-01-03: 2 mg via INTRAVENOUS

## 2019-01-03 MED ORDER — LIDOCAINE VISCOUS HCL 2 % MT SOLN
OROMUCOSAL | Status: AC
  Filled 2019-01-03: qty 15

## 2019-01-03 MED ORDER — MORPHINE SULFATE (PF) 2 MG/ML IV SOLN
INTRAVENOUS | Status: AC
  Filled 2019-01-03: qty 1

## 2019-01-03 NOTE — Progress Notes (Signed)
Patient is currently followed by [facility] hospice at [facility] with a hospice diagnosis of cerebral vascular disease. She is a DNR code, with out of facility DNR in place in the facility.pateint was sent to the [facility] Ed overnight by facility staff due to her G-tube being displaced. Per chart note review G-tube was unable to be replaced in the ED. Patient has also tested positive for COVID 19. Several conversations have been held between myself, her hospice RN, [place], attending physician, [facility] and patient's daughter. Plan at this time is for patient to have her G-tube replaced by IR here at [facility], with planned return to [place] care. Staff RN Almyra Free aware. Will continue to follow and update hospice team. In person visit will not be done by writer due to COVID positive status.  Dana Grimes BSN, RN, [facility] [phone]

## 2019-01-03 NOTE — ED Triage Notes (Signed)
Pt to the er for dislodged g tube. Pt has a hx of pulling out her tube. [place] is still inflated. 16 gauge g tube was brought in by EMS. Pt is nonverbal and has contractions to all 4 extremities. Pt is wearing padded boots for her heels. Daughter is en route. No bleeding noted at site.

## 2019-01-03 NOTE — Progress Notes (Signed)
DW with patients daughter, who wants patient to be discharged back to [facility] care facility once PEG tube palcement is done

## 2019-01-03 NOTE — ED Notes (Signed)
Patient is resting comfortably. 

## 2019-01-03 NOTE — Progress Notes (Signed)
Discussed with patient's daughter in detail Patient is DNR by CODE STATUS No cardiac resuscitation intubation ventilator if the need arises Patient is DNR

## 2019-01-03 NOTE — Progress Notes (Signed)
Order received for chest x ray stat so pharmacy can start treatment

## 2019-01-03 NOTE — TOC Initial Note (Signed)
Transition of Care [facility]) - Initial/Assessment Note    Patient Details  Name: Dana Grimes MRN: [id number] Date of Birth: [birth year]  Transition of Care [facility]) CM/SW Contact:    Ross Ludwig, LCSW Phone Number: 01/03/2019, 10:20 AM  Clinical Narrative:                  CSW was informed that patient is a long term care resident at [facility].  Patient is also being followed by [facility] service.  CSW was informed by Santiago Glad at [facility], that patient's daughter does not want her transferred to [facility].  CSW updated physician, he was going to speak to IR to see if the G tube can be replaced here or if she needs to be transferred.  CSW spoke to [facility], and they said they can accept patient back even though she is Covid positive.  CSW updated physician, and hospice agency.  CSW to continue to follow patient's progress throughout discharge planning.  Expected Discharge Plan: [place] Barriers to Discharge: Continued Medical Work up   Patient Goals and CMS Choice Patient states their goals for this hospitalization and ongoing recovery are:: To return back to [facility] SNF CMS Medicare.gov Compare Post Acute Care list provided to:: Patient Represenative (must comment) Choice offered to / list presented to : Patient  Expected Discharge Plan and Services Expected Discharge Plan: [place] Choice: [place] Living arrangements for the past 2 months: [place]                                      Prior Living Arrangements/Services Living arrangements for the past 2 months: [place] Lives with:: Facility Resident Patient language and need for interpreter reviewed:: Yes Do you feel safe going back to the place where you live?: Yes      Need for Family Participation in Patient Care: No (Comment) Care giver support system in  place?: No (comment)   Criminal Activity/Legal Involvement Pertinent to Current Situation/Hospitalization: No - Comment as needed  Activities of Daily Living Home Assistive Devices/Equipment: Wheelchair(Contracted all four limbs) ADL Screening (condition at time of admission) Patient's cognitive ability adequate to safely complete daily activities?: No Does the patient have difficulty concentrating, remembering, or making decisions?: Yes Patient able to express need for assistance with ADLs?: Yes Does the patient have difficulty dressing or bathing?: Yes Independently performs ADLs?: No Communication: Dependent Is this a change from baseline?: Pre-admission baseline Dressing (OT): Dependent Is this a change from baseline?: Pre-admission baseline Grooming: Dependent Is this a change from baseline?: Pre-admission baseline Feeding: Dependent Is this a change from baseline?: Pre-admission baseline Bathing: Dependent Is this a change from baseline?: Pre-admission baseline Toileting: Dependent Is this a change from baseline?: Pre-admission baseline In/Out Bed: Dependent Is this a change from baseline?: Pre-admission baseline Walks in Home: Dependent Is this a change from baseline?: Pre-admission baseline Does the patient have difficulty walking or climbing stairs?: Yes Weakness of Legs: Both Weakness of Arms/Hands: Both  Permission Sought/Granted Permission sought to share information with : Facility Sport and exercise psychologist, Family Supports Permission granted to share information with : Yes, Verbal Permission Granted  Share Information with NAME: [place]in Daughter [phone]  or [place], [place] Daughter [phone]  [phone] or Elida, [place] [phone]  Permission granted to share info w AGENCY: SNF  admissions        Emotional Assessment Appearance:: Appears stated age   Affect (typically observed): Accepting, Appropriate, Calm Orientation: : Oriented to  Self Alcohol / Substance Use: Not Applicable Psych Involvement: No (comment)  Admission diagnosis:  Dislodged gastrostomy tube [X38.182X] Patient Active Problem List   Diagnosis Date Noted  . AKI (acute kidney injury) (HCC) 01/03/2019  . Debility 08/07/2018  . Memory deficit 05/07/2018  . General body deterioration 05/07/2018  . Palliative care encounter 03/25/2018  . Memory loss 03/25/2018  . Fever 03/25/2018  . Protein-calorie malnutrition, severe (HCC) 06/27/2014  . Dehydration 06/26/2014  . Failure to thrive in adult 06/26/2014  . Hypernatremia 06/26/2014  . Dementia (HCC) 06/26/2014  . Hypothyroidism 06/26/2014  . Acute constipation 03/25/2014  . Hemorrhoids 03/25/2014  . Insomnia 03/25/2014  . Depression 11/21/2013  . Vitamin D deficiency 11/21/2013  . Dermatitis 11/07/2013  . Loss of appetite 11/07/2013  . Mixed hyperlipidemia 03/06/2007  . Hypertension, benign 01/09/2006   PCP:  Abner Greenspan, MD Pharmacy:  No Pharmacies Listed    Social Determinants of Health (SDOH) Interventions    Readmission Risk Interventions No flowsheet data found.

## 2019-01-03 NOTE — Progress Notes (Signed)
Discussed with family medical condition treatment plan Contacted interventional radiology will get PEG tube replaced this afternoon at [facility] COVID-19 positive airborne precautions and contact precautions Family updated

## 2019-01-03 NOTE — TOC Transition Note (Signed)
Transition of Care [facility]) - CM/SW Discharge Note   Patient Details  Name: Dana Grimes MRN: [id number] Date of Birth: [birth year]  Transition of Care [facility]) CM/SW Contact:  Ross Ludwig, LCSW Phone Number: 01/03/2019, 4:14 PM   Clinical Narrative:    CSW spoke with [facility] and they can accept patient back today.  Patient is Covid positive, but is able to return back to [facility].  Patient to be d/c'ed today to [facility] room 41.  Patient and family agreeable to plans will transport via ems RN to call report [phone].  Final next level of care: Skilled Nursing Facility Barriers to Discharge: Barriers Resolved   Patient Goals and CMS Choice Patient states their goals for this hospitalization and ongoing recovery are:: To return back to [place] Medicare.gov Compare Post Acute Care list provided to:: Patient Choice offered to / list presented to : Adult Children  Discharge Placement   Existing PASRR number confirmed : 01/03/19          Patient chooses bed at: [facility] Patient to be transferred to facility by: [facility] Name of family member notified: Fulmore,Robin Daughter [phone] Patient and family notified of of transfer: 01/03/19  Discharge Plan and Services     Post Acute Care Choice: [place]          DME Arranged: N/A                    Social Determinants of Health (SDOH) Interventions     Readmission Risk Interventions No flowsheet data found.

## 2019-01-03 NOTE — Progress Notes (Signed)
Advanced care plan. Purpose of the Encounter: CODE STATUS Parties in Attendance: Patient Patient's Decision Capacity: Not good Subjective/Patient's story: The patient with past medical history of dementia, hypertension, hypothyroidism and chronic kidney disease presents to the emergency department via EMS due to dislodged G-tube.  The patient does not voice complaints and does not seem uncomfortable except for when examining the stoma.  Laboratory evaluation also revealed acute on chronic kidney injury.  The patient was given intravenous fluid in the emergency department.  Two attempts were made at G-tube placement unsuccessfully which prompted the emergency department staff call hospitalist service for admission Objective/Medical story Patient needs a interventional radiology consultation for PEG tube placement Needs IV fluids Goals of care determination:  Advance care directives goals of care treatment plan discussed with patient's daughter Did not want any resuscitation, intubation ventilator if the need arises CODE STATUS: DNR Time spent discussing advanced care planning: 16 minutes

## 2019-01-03 NOTE — H&P (Signed)
Dana Grimes is an 73 y.o. female.   Chief Complaint: Dislodged gastric tube HPI: The patient with past medical history of dementia, hypertension, hypothyroidism and chronic kidney disease presents to the emergency department via EMS due to dislodged G-tube.  The patient does not voice complaints and does not seem uncomfortable except for when examining the stoma.  Laboratory evaluation also revealed acute on chronic kidney injury.  The patient was given intravenous fluid in the emergency department.  Two attempts were made at G-tube placement unsuccessfully which prompted the emergency department staff call hospitalist service for admission  Past Medical History:  Diagnosis Date  . Abnormal weight loss   . Alzheimer's dementia (Dana Grimes)   . Anxiety   . Arthritis   . Chronic kidney disease   . Dementia without behavioral disturbance (Dana Grimes)   . Gastric reflux   . Hypertension   . Major depressive disorder   . Thyroid disease    hypothyroidism  . Unspecified mood (affective) disorder (Dana Grimes)   . Vitamin D deficiency     Past Surgical History:  Procedure Laterality Date  . ABDOMINAL HYSTERECTOMY    . PEG PLACEMENT N/A 06/27/2014   Procedure: PERCUTANEOUS ENDOSCOPIC GASTROSTOMY (PEG) PLACEMENT;  Surgeon: Rogene Houston, MD;  Location: [facility];  Service: Endoscopy;  Laterality: N/A;    No family history on file.  Patient cannot contribute to family history due to dementia  Social History:  reports that she has never smoked. She has never used smokeless tobacco. She reports that she does not drink alcohol. No history on file for drug.  Allergies: No Known Allergies  Medications Prior to Admission  Medication Sig Dispense Refill  . acetaminophen (TYLENOL) 325 MG tablet Place 650 mg into feeding tube 4 (four) times daily.     . APTIOM 400 MG TABS Place 400 mg into feeding tube daily.     Marland Kitchen levothyroxine (SYNTHROID) 112 MCG tablet Place 112 mcg into feeding tube daily before  breakfast.     . polyethylene glycol powder (GLYCOLAX/MIRALAX) powder Place 17 g into feeding tube every Monday, Wednesday, and Friday. *Mixed in 8 oz of water/beverage    . senna (SENOKOT) 8.6 MG tablet Place 1 tablet into feeding tube 2 (two) times daily.     Marland Kitchen valproic acid (DEPAKENE) 250 MG/5ML SOLN solution Place 125 mg into feeding tube daily.     Marland Kitchen valproic acid (DEPAKENE) 250 MG/5ML SOLN solution Take 250 mg by mouth at bedtime.      Results for orders placed or performed during the hospital encounter of 01/02/19 (from the past 48 hour(s))  Basic metabolic panel     Status: Abnormal   Collection Time: 01/03/19 12:56 AM  Result Value Ref Range   Sodium 150 (H) 135 - 145 mmol/L   Potassium 4.4 3.5 - 5.1 mmol/L   Chloride 114 (H) 98 - 111 mmol/L   CO2 25 22 - 32 mmol/L   Glucose, Bld 113 (H) 70 - 99 mg/dL   BUN 43 (H) 8 - 23 mg/dL   Creatinine, Ser 1.38 (H) 0.44 - 1.00 mg/dL   Calcium 9.4 8.9 - 10.3 mg/dL   GFR calc non Af Amer 38 (L) >60 mL/min   GFR calc Af Amer 44 (L) >60 mL/min   Anion gap 11 5 - 15    Comment: Performed at [facility], [address], [place], [place] [address]  CBC     Status: Abnormal   Collection Time: 01/03/19 12:56 AM  Result Value Ref Range   WBC 9.4 4.0 - 10.5 K/uL   RBC 3.93 3.87 - 5.11 MIL/uL   Hemoglobin 9.6 (L) 12.0 - 15.0 g/dL   HCT 28.3 (L) 66.2 - 94.7 %   MCV 80.7 80.0 - 100.0 fL   MCH 24.4 (L) 26.0 - 34.0 pg   MCHC 30.3 30.0 - 36.0 g/dL   RDW 65.4 65.0 - 35.4 %   Platelets 296 150 - 400 K/uL   nRBC 0.0 0.0 - 0.2 %    Comment: Performed at [facility], [address], [place], [place] [address]   No results found.  Review of Systems  Unable to perform ROS: Dementia    Blood pressure (!) 102/56, pulse 87, temperature 99.3 F (37.4 C), temperature source Axillary, resp. rate 18, height 5\' 8"  (1.727 m), weight 68 kg, SpO2 94 %. Physical Exam  Vitals reviewed. Constitutional: She is oriented to person, place,  and time. She appears well-developed and well-nourished. No distress.  HENT:  Head: Normocephalic and atraumatic.  Mouth/Throat: Oropharynx is clear and moist.  Eyes: Pupils are equal, round, and reactive to light. Conjunctivae and EOM are normal. No scleral icterus.  Neck: Normal range of motion. Neck supple. No JVD present. No tracheal deviation present. No thyromegaly present.  Cardiovascular: Normal rate, regular rhythm and normal heart sounds. Exam reveals no gallop and no friction rub.  No murmur heard. Respiratory: Effort normal and breath sounds normal.  GI: Soft. Bowel sounds are normal. She exhibits no distension. There is no abdominal tenderness.  Genitourinary:    Genitourinary Comments: Deferred   Musculoskeletal: Normal range of motion.        General: No edema.  Lymphadenopathy:    She has no cervical adenopathy.  Neurological: She is alert and oriented to person, place, and time. No cranial nerve deficit. She exhibits normal muscle tone.  Skin: Skin is warm and dry. No erythema.  Psychiatric:  Difficult to assess mental status due to dementia     Assessment/Plan This is a 73 year old female admitted for acute kidney injury. 1.  AKI: Superimposed upon chronic kidney injury.  Hydrate with intravenous fluid.  Avoid nephrotoxic agents. 2.  Hypothyroidism: Check TSH; continue Synthroid once G-tube placement achieved 3.  Malfunction of gastrectomy tube: Consult interventional radiology for placement.  Must call the patient's daughter prior to procedure to discuss plan. 4.  Mood disorder: Continue Depakote once G-tube replaced.  No history of seizures in the patient's record. 5.  DVT prophylaxis: SCDs for now 6.  GI prophylaxis: None The patient is a full code.  Time spent on admission orders and patient care approximately 45 minutes  65, MD 01/03/2019, 4:04 AM

## 2019-01-03 NOTE — ED Notes (Addendum)
ED TO INPATIENT HANDOFF REPORT  ED Nurse Name and Phone #: Karena Addison [phone]  S Name/Age/Gender Dana Grimes 73 y.o. female Room/Bed: ED10A/ED10A  Code Status   Code Status: Prior  Home/SNF/Other Nursing Home Pt is non verbal Is this baseline? Yes   Triage Complete: Triage complete  Chief Complaint G-Tube Replacement  Triage Note Pt to the er for dislodged g tube. Pt has a hx of pulling out her tube. Baloon is still inflated. 16 gauge g tube was brought in by EMS. Pt is nonverbal and has contractions to all 4 extremities. Pt is wearing padded boots for her heels. Daughter is en route. No bleeding noted at site.    Allergies No Known Allergies  Level of Care/Admitting Diagnosis ED Disposition    ED Disposition Condition [facility] Area: [place] [100120]  Level of Care: Med-Surg [16]  Covid Evaluation: Asymptomatic Screening Protocol (No Symptoms)  Diagnosis: AKI (acute kidney injury) [facility]) [[id number]]  Admitting Physician: Harrie Foreman [[id number]]  Attending Physician: Harrie Foreman [[id number]]  Estimated length of stay: past midnight tomorrow  Certification:: I certify this patient will need inpatient services for at least 2 midnights  PT Class (Do Not Modify): Inpatient [101]  PT Acc Code (Do Not Modify): Private [1]       B Medical/Surgery History Past Medical History:  Diagnosis Date  . Abnormal weight loss   . Alzheimer's dementia ([place])   . Anxiety   . Arthritis   . Chronic kidney disease   . Dementia without behavioral disturbance ([place])   . Gastric reflux   . Hypertension   . Major depressive disorder   . Thyroid disease    hypothyroidism  . Unspecified mood (affective) disorder ([place])   . Vitamin D deficiency    Past Surgical History:  Procedure Laterality Date  . ABDOMINAL HYSTERECTOMY    . PEG PLACEMENT N/A 06/27/2014   Procedure: PERCUTANEOUS ENDOSCOPIC GASTROSTOMY (PEG) PLACEMENT;  Surgeon: Rogene Houston, MD;  Location[facility];  Service: Endoscopy;  Laterality: N/A;     A IV Location/Drains/Wounds Patient Lines/Drains/Airways Status   Active Line/Drains/Airways    Name:   Placement date:   Placement time:   Site:   Days:   Peripheral IV 01/03/19 Left;Posterior Forearm   01/03/19    0105    Forearm   less than 1   Gastrostomy/Enterostomy Gastrostomy 16 Fr. LUQ   05/18/16    0219    LUQ   960   Incision (Closed) 06/27/14 Abdomen Left   06/27/14    1400     1651          Intake/Output Last 24 hours No intake or output data in the 24 hours ending 01/03/19 0332  Labs/Imaging Results for orders placed or performed during the hospital encounter of 01/02/19 (from the past 48 hour(s))  Basic metabolic panel     Status: Abnormal   Collection Time: 01/03/19 12:56 AM  Result Value Ref Range   Sodium 150 (H) 135 - 145 mmol/L   Potassium 4.4 3.5 - 5.1 mmol/L   Chloride 114 (H) 98 - 111 mmol/L   CO2 25 22 - 32 mmol/L   Glucose, Bld 113 (H) 70 - 99 mg/dL   BUN 43 (H) 8 - 23 mg/dL   Creatinine, Ser 1.38 (H) 0.44 - 1.00 mg/dL   Calcium 9.4 8.9 - 10.3 mg/dL   GFR calc non Af Amer 38 (L) >60 mL/min  GFR calc Af Amer 44 (L) >60 mL/min   Anion gap 11 5 - 15    Comment: Performed at [facility], [address] [place], [place] [address]  CBC     Status: Abnormal   Collection Time: 01/03/19 12:56 AM  Result Value Ref Range   WBC 9.4 4.0 - 10.5 K/uL   RBC 3.93 3.87 - 5.11 MIL/uL   Hemoglobin 9.6 (L) 12.0 - 15.0 g/dL   HCT 97.6 (L) 73.4 - 19.3 %   MCV 80.7 80.0 - 100.0 fL   MCH 24.4 (L) 26.0 - 34.0 pg   MCHC 30.3 30.0 - 36.0 g/dL   RDW 79.0 24.0 - 97.3 %   Platelets 296 150 - 400 K/uL   nRBC 0.0 0.0 - 0.2 %    Comment: Performed at [facility], [address] [place], [place] [address]   No results found.  Pending Labs [employer] (From admission, onward)    Start     Ordered   01/03/19 0133  SARS CORONAVIRUS 2 (TAT 6-24 HRS) Nasopharyngeal  Nasopharyngeal Swab  (Asymptomatic/Tier 2 Patients Labs)  Once,   STAT    Question Answer Comment  Is this test for diagnosis or screening Screening   Symptomatic for COVID-19 as defined by CDC No   Hospitalized for COVID-19 No   Admitted to ICU for COVID-19 No   Previously tested for COVID-19 No   Resident in a congregate (group) care setting No   Employed in healthcare setting No   Pregnant No      01/03/19 0132   Signed and Held  TSH  Add-on,   R     Signed and Held   Signed and Held  Hemoglobin A1c  Add-on,   R     Signed and Held          Vitals/Pain Today's Vitals   01/03/19 0000 01/03/19 0045 01/03/19 0100 01/03/19 0115  BP: (!) 126/100 100/85 110/60 91/68  Pulse: 86 91 90 83  Resp:      Temp:      TempSrc:      SpO2: 97% 96% 95% 96%  Weight:      Height:        Isolation Precautions No active isolations  Medications Medications  morphine 2 MG/ML injection 2 mg (2 mg Intravenous Given 01/03/19 0055)    Mobility non-ambulatory High fall risk   Focused Assessments    R Recommendations: See Admitting Provider Note  Report given to: Marylu Lund, RN

## 2019-01-03 NOTE — Progress Notes (Signed)
Received to room 220 from ER via stretcher. Assisted to bed and positioned for comfort. Nonverbal. Extremities contracted x 4 with soft boots on both feet. Wounds noted to right foot and toes with foul odor noted.

## 2019-01-03 NOTE — NC FL2 (Signed)
[place] MEDICAID FL2 LEVEL OF CARE SCREENING TOOL     IDENTIFICATION  Patient Name: Dana Grimes Birthdate: 1945-11-13 Sex: female Admission Date (Current Location): 01/02/2019  [place] and [place] Number:  Chiropodist and Address:  [facility], [address], [place], [place] [address]      Provider Number: [id number]  Attending Physician Name and Address:  Ihor Austin, MD  Relative Name and Phone Number:  Fulmore,Robin Daughter [phone] or    Current Level of Care: Hospital Recommended Level of Care: Skilled Nursing Facility Prior Approval Number:    Date Approved/Denied:   PASRR Number: [id number]  Discharge Plan: SNF    Current Diagnoses: Patient Active Problem List   Diagnosis Date Noted  . AKI (acute kidney injury) (HCC) 01/03/2019  . Pressure injury of skin 01/03/2019  . Debility 08/07/2018  . Memory deficit 05/07/2018  . General body deterioration 05/07/2018  . Palliative care encounter 03/25/2018  . Memory loss 03/25/2018  . Fever 03/25/2018  . Protein-calorie malnutrition, severe (HCC) 06/27/2014  . Dehydration 06/26/2014  . Failure to thrive in adult 06/26/2014  . Hypernatremia 06/26/2014  . Dementia (HCC) 06/26/2014  . Hypothyroidism 06/26/2014  . Acute constipation 03/25/2014  . Hemorrhoids 03/25/2014  . Insomnia 03/25/2014  . Depression 11/21/2013  . Vitamin D deficiency 11/21/2013  . Dermatitis 11/07/2013  . Loss of appetite 11/07/2013  . Mixed hyperlipidemia 03/06/2007  . Hypertension, benign 01/09/2006    Orientation RESPIRATION BLADDER Height & Weight     Self  Normal Incontinent Weight: 137 lb 2 oz (62.2 kg) Height:  5\' 8"  (172.7 cm)  BEHAVIORAL SYMPTOMS/MOOD NEUROLOGICAL BOWEL NUTRITION STATUS      Incontinent Feeding tube  AMBULATORY STATUS COMMUNICATION OF NEEDS Skin   Total Care Verbally PU Stage and Appropriate Care                       Personal Care Assistance Level  of Assistance  Bathing, Dressing, Feeding Bathing Assistance: Maximum assistance Feeding assistance: Maximum assistance Dressing Assistance: Maximum assistance     Functional Limitations Info  Sight, Speech, Hearing Sight Info: Adequate Hearing Info: Adequate Speech Info: Adequate    SPECIAL CARE FACTORS FREQUENCY                       Contractures Contractures Info: Not present    Additional Factors Info  Code Status, Allergies Code Status Info: DNR Allergies Info: No Known Allergies           Current Medications (01/03/2019):  This is the current hospital active medication list Current Facility-Administered Medications  Medication Dose Route Frequency Provider Last Rate Last Dose  . 0.9 %  sodium chloride infusion   Intravenous Continuous 03/05/2019, MD 125 mL/hr at 01/03/19 0720    . acetaminophen (TYLENOL) tablet 650 mg  650 mg Oral Q6H PRN 03/05/19, MD       Or  . acetaminophen (TYLENOL) suppository 650 mg  650 mg Rectal Q6H PRN Arnaldo Natal, MD   650 mg at 01/03/19 [phone]  . dexamethasone (DECADRON) injection 6 mg  6 mg Intravenous Q24H 5277, MD   6 mg at 01/03/19 1220  . levothyroxine (SYNTHROID) tablet 112 mcg  112 mcg Per Tube QAC breakfast 03/05/19, MD      . lidocaine (XYLOCAINE) 2 % viscous mouth solution           . ondansetron (ZOFRAN)  tablet 4 mg  4 mg Oral Q6H PRN Harrie Foreman, MD       Or  . ondansetron [facility]) injection 4 mg  4 mg Intravenous Q6H PRN Harrie Foreman, MD      . valproic acid (DEPAKENE) solution 125 mg  125 mg Per Tube Daily Harrie Foreman, MD      . valproic acid (DEPAKENE) solution 250 mg  250 mg Oral QHS Harrie Foreman, MD         Discharge Medications: Please see discharge summary for a list of discharge medications.  Relevant Imaging Results:  Relevant Lab Results:   Additional Information SSN [id number]  Ross Ludwig, LCSW

## 2019-01-03 NOTE — ED Notes (Signed)
IV site wrapped in coban.

## 2019-01-03 NOTE — ED Notes (Signed)
Pt given pain meds and blood sent to the lab.

## 2019-01-03 NOTE — Discharge Summary (Addendum)
[facility] - [place] [facility]   PATIENT NAME: Dana Grimes    MR#:  [id number]  DATE OF BIRTH:  Dec 15, 1945  DATE OF ADMISSION:  01/02/2019 ADMITTING PHYSICIAN: Arnaldo Natal, MD  DATE OF DISCHARGE: 01/03/2019  PRIMARY CARE PHYSICIAN: Abner Greenspan, MD   ADMISSION DIAGNOSIS:  Dislodged gastrostomy tube [K34.917H]  DISCHARGE DIAGNOSIS:  Active Problems:   AKI (acute kidney injury) (HCC)   Pressure injury of skin Dislodged gastrostomy tube COVID-19 infection SECONDARY DIAGNOSIS:   Past Medical History:  Diagnosis Date  . Abnormal weight loss   . Alzheimer's dementia (HCC)   . Anxiety   . Arthritis   . Chronic kidney disease   . Dementia without behavioral disturbance (HCC)   . Gastric reflux   . Hypertension   . Major depressive disorder   . Thyroid disease    hypothyroidism  . Unspecified mood (affective) disorder (HCC)   . Vitamin D deficiency      ADMITTING HISTORY  The patient with past medical history of dementia, hypertension, hypothyroidism and chronic kidney disease presents to the emergency department via EMS due to dislodged G-tube.  The patient does not voice complaints and does not seem uncomfortable except for when examining the stoma.  Laboratory evaluation also revealed acute on chronic kidney injury.  The patient was given intravenous fluid in the emergency department.  Two attempts were made at G-tube placement unsuccessfully which prompted the emergency department staff call hospitalist service for admission  HOSPITAL COURSE:  Patient was admitted for dislodged gastrostomy tube.  Patient was also dry and dehydrated and hydrated with normal saline.  COVID-19 test was positive.  Patient not a candidate for remdisivir injection as per criteria.  Interventional radiology consultation was done and patient PEG tube was placed.  Discussed with patient's daughter patient is DNR by CODE STATUS.  [facility]  Advise a free water 150 mL every 6 hourly for the next couple of days via PEG tube for dehydration.  Further symptomatic care along with quarantine for 2 weeks for COVID-19 infection.  Telehealth services utilized  CONSULTS OBTAINED:    DRUG ALLERGIES:  No Known Allergies  DISCHARGE MEDICATIONS:   Allergies as of 01/03/2019   No Known Allergies     Medication List    TAKE these medications   acetaminophen 325 MG tablet Commonly known as: TYLENOL Place 650 mg into feeding tube 4 (four) times daily.   Aptiom 400 MG Tabs Generic drug: Eslicarbazepine Acetate Place 400 mg into feeding tube daily.   levothyroxine 112 MCG tablet Commonly known as: SYNTHROID Place 112 mcg into feeding tube daily before breakfast.   polyethylene glycol powder 17 GM/SCOOP powder Commonly known as: GLYCOLAX/MIRALAX Place 17 g into feeding tube every Monday, Wednesday, and Friday. *Mixed in 8 oz of water/beverage   senna 8.6 MG tablet Commonly known as: SENOKOT Place 1 tablet into feeding tube 2 (two) times daily.   valproic acid 250 MG/5ML Soln solution Commonly known as: DEPAKENE Take 250 mg by mouth at bedtime.   valproic acid 250 MG/5ML Soln solution Commonly known as: DEPAKENE Place 125 mg into feeding tube daily.     Free water 150 mL every 6 hourly via PEG tube for next 4 days  Today  Patient evaluated Interventional radiology help for PEG placement VITAL SIGNS:  Blood pressure 103/80, pulse 83, temperature 100.3 F (37.9 C), temperature source Oral, resp. rate 20, height 5'  8" (1.727 m), weight 62.2 kg, SpO2 98 %.  I/O:  No intake or output data in the 24 hours ending 01/03/19 1447  PHYSICAL EXAMINATION:  Physical Exam  GENERAL:  73 y.o.-year-old patient lying in the bed with no acute distress.  LUNGS: Normal breath sounds bilaterally, no wheezing, rales,rhonchi or crepitation. No use of accessory muscles of respiration.  CARDIOVASCULAR: S1,  S2 normal. No murmurs, rubs, or gallops.  ABDOMEN: Soft, non-tender, non-distended. Bowel sounds present. No organomegaly or mass.  PEG tube noted NEUROLOGIC: Has dementia PSYCHIATRIC: could not be assessed SKIN: No obvious rash, lesion, or ulcer.   DATA REVIEW:   CBC Recent Labs  Lab 01/03/19 0056  WBC 9.4  HGB 9.6*  HCT 31.7*  PLT 296    Chemistries  Recent Labs  Lab 01/03/19 0056  NA 150*  K 4.4  CL 114*  CO2 25  GLUCOSE 113*  BUN 43*  CREATININE 1.38*  CALCIUM 9.4    Cardiac Enzymes No results for input(s): TROPONINI in the last 168 hours.  Microbiology Results  Results for orders placed or performed during the hospital encounter of 01/02/19  SARS CORONAVIRUS 2 (TAT 6-24 HRS) Nasopharyngeal Nasopharyngeal Swab     Status: Abnormal   Collection Time: 01/03/19  1:58 AM   Specimen: Nasopharyngeal Swab  Result Value Ref Range Status   SARS Coronavirus 2 POSITIVE (A) NEGATIVE Final    Comment: (NOTE) SARS-CoV-2 target nucleic acids are DETECTED. The SARS-CoV-2 RNA is generally detectable in upper and lower respiratory specimens during the acute phase of infection. Positive results are indicative of active infection with SARS-CoV-2. Clinical  correlation with patient history and other diagnostic information is necessary to determine patient infection status. Positive results do  not rule out bacterial infection or co-infection with other viruses. The expected result is Negative. Fact Sheet for Patients: SugarRoll.be Fact Sheet for Healthcare Providers: https://www.woods-mathews.com/ This test is not yet approved or cleared by the [place] FDA and  has been authorized for detection and/or diagnosis of SARS-CoV-2 by FDA under an Emergency Use Authorization (EUA). This EUA will remain  in effect (meaning this test can be used) for the duration of the COVID-19 declaration under Section 564(b)(1) of the Act, 21  U.S.C.  section 360bbb-3(b)(1), unless the authorization is terminated or revoked sooner. Performed at [facility], [place] [address] [place], [place] [address]   MRSA PCR Screening     Status: Abnormal   Collection Time: 01/03/19 10:47 AM   Specimen: Nasopharyngeal  Result Value Ref Range Status   MRSA by PCR POSITIVE (A) NEGATIVE Final    Comment:        The GeneXpert MRSA Assay (FDA approved for NASAL specimens only), is one component of a comprehensive MRSA colonization surveillance program. It is not intended to diagnose MRSA infection nor to guide or monitor treatment for MRSA infections. RESULT CALLED TO, READ BACK BY AND VERIFIED WITH: JULIE BRYAN AT 1425 01/03/2019.PMF Performed at [facility], [address] [place], [place] [address]     RADIOLOGY:  Dg Chest Port 1 View  Result Date: 01/03/2019 CLINICAL DATA:  COVID-19 positive. EXAM: PORTABLE CHEST 1 VIEW COMPARISON:  06/10/2014 FINDINGS: Low lung volumes. Mild cardiomegaly. Bibasilar atelectasis. No effusions or edema. No acute bony abnormality. IMPRESSION: Low lung volumes, bibasilar atelectasis. Electronically Signed   By: Rolm Baptise M.D.   On: 01/03/2019 09:34    Follow up with PCP in 1 week.  Management plans discussed with the  patient, family and they are in agreement.  CODE STATUS: DNR    Code Status Orders  (From admission, onward)         Start     Ordered   01/03/19 1006  Do not attempt resuscitation (DNR)  Continuous    Question Answer Comment  In the event of cardiac or respiratory ARREST Do not call a "code blue"   In the event of cardiac or respiratory ARREST Do not perform Intubation, CPR, defibrillation or ACLS   In the event of cardiac or respiratory ARREST Use medication by any route, position, wound care, and other measures to relive pain and suffering. May use oxygen, suction and manual treatment of airway obstruction as needed for comfort.      01/03/19 1005         Code Status History    Date Active Date Inactive Code Status Order ID Comments User Context   01/03/2019 0652 01/03/2019 1002 Full Code [id number]  Arnaldo NatalDiamond, Michael S, MD Inpatient   06/26/2014 2126 07/02/2014 1921 Full Code [id number]  Eduard ClosKakrakandy, Arshad N, MD Inpatient   Advance Care Planning Activity      TOTAL TIME TAKING CARE OF THIS PATIENT ON DAY OF DISCHARGE: more than 30 minutes.   Ihor AustinPavan Lalaine Overstreet M.D on 01/03/2019 at 2:47 PM  Between 7am to 6pm - Pager - [phone]  After 6pm go to [email or web address] - password [email or web address] [facility]  [facility]  Office  [phone]  CC: Primary care physician; Abner GreenspanHodges, Beth, MD  Note: This dictation was prepared with Dragon dictation along with smaller phrase technology. Any transcriptional errors that result from this process are unintentional.

## 2019-01-03 NOTE — NC FL2 (Signed)
[place] LEVEL OF CARE SCREENING TOOL     IDENTIFICATION  Patient Name: Dana Grimes Birthdate: 1945-09-08 Sex: female Admission Date (Current Location): 01/02/2019  [place] and [place] Number:  Engineering geologist and Address:  [facility], [address] [place], [place] [address]      Provider Number: [id number]  Attending Physician Name and Address:  Saundra Shelling, MD  Relative Name and Phone Number:  [facility] Daughter [phone]  or Ersie, Savino Daughter [phone]  [phone] or Meena, Barrantes [phone]    Current Level of Care: Hospital Recommended Level of Care: [place] Prior Approval Number:    Date Approved/Denied:   PASRR Number: [id number]  Discharge Plan: SNF    Current Diagnoses: Patient Active Problem List   Diagnosis Date Noted  . AKI (acute kidney injury) ([place]) 01/03/2019  . Debility 08/07/2018  . Memory deficit 05/07/2018  . General body deterioration 05/07/2018  . Palliative care encounter 03/25/2018  . Memory loss 03/25/2018  . Fever 03/25/2018  . Protein-calorie malnutrition, severe ([place]) 06/27/2014  . Dehydration 06/26/2014  . Failure to thrive in adult 06/26/2014  . Hypernatremia 06/26/2014  . Dementia ([place]) 06/26/2014  . Hypothyroidism 06/26/2014  . Acute constipation 03/25/2014  . Hemorrhoids 03/25/2014  . Insomnia 03/25/2014  . Depression 11/21/2013  . Vitamin D deficiency 11/21/2013  . Dermatitis 11/07/2013  . Loss of appetite 11/07/2013  . Mixed hyperlipidemia 03/06/2007  . Hypertension, benign 01/09/2006    Orientation RESPIRATION BLADDER Height & Weight     Self  Normal Incontinent Weight: 137 lb 2 oz (62.2 kg) Height:  5\' 8"  (172.7 cm)  BEHAVIORAL SYMPTOMS/MOOD NEUROLOGICAL BOWEL NUTRITION STATUS      Incontinent Feeding tube  AMBULATORY STATUS COMMUNICATION OF NEEDS Skin   Extensive Assist Verbally Normal                        Personal Care Assistance Level of Assistance  Feeding, Bathing, Dressing Bathing Assistance: Maximum assistance Feeding assistance: Maximum assistance Dressing Assistance: Maximum assistance     Functional Limitations Info  Sight, Hearing, Speech Sight Info: Adequate Hearing Info: Adequate Speech Info: Adequate    SPECIAL CARE FACTORS FREQUENCY                       Contractures Contractures Info: Not present    Additional Factors Info  Code Status, Allergies Code Status Info: DNR Allergies Info: [id number]           Current Medications (01/03/2019):  This is the current hospital active medication list Current Facility-Administered Medications  Medication Dose Route Frequency Provider Last Rate Last Dose  . 0.9 %  sodium chloride infusion   Intravenous Continuous Harrie Foreman, MD 125 mL/hr at 01/03/19 0720    . acetaminophen (TYLENOL) tablet 650 mg  650 mg Oral Q6H PRN Harrie Foreman, MD       Or  . acetaminophen (TYLENOL) suppository 650 mg  650 mg Rectal Q6H PRN Harrie Foreman, MD   650 mg at 01/03/19 [phone]  . dexamethasone (DECADRON) injection 6 mg  6 mg Intravenous Q24H Pyreddy, Pavan, MD      . enoxaparin (LOVENOX) injection 60 mg  1 mg/kg Subcutaneous Once Pyreddy, Pavan, MD      . levothyroxine (SYNTHROID) tablet 112 mcg  112 mcg Per Tube QAC breakfast Harrie Foreman, MD      . ondansetron [facility]) tablet 4 mg  4  mg Oral Q6H PRN Arnaldo Natal, MD       Or  . ondansetron [facility]) injection 4 mg  4 mg Intravenous Q6H PRN Arnaldo Natal, MD      . valproic acid (DEPAKENE) solution 125 mg  125 mg Per Tube Daily Arnaldo Natal, MD      . valproic acid (DEPAKENE) solution 250 mg  250 mg Oral QHS Arnaldo Natal, MD         Discharge Medications: Please see discharge summary for a list of discharge medications.  Relevant Imaging Results:  Relevant Lab Results:   Additional Information [id number]  Darleene Cleaver,  LCSW

## 2019-01-03 NOTE — ED Notes (Signed)
Multiple attempts by Dr Owens Shark to replace g tube are unsuccessful.

## 2019-01-03 NOTE — ED Notes (Signed)
Daughter at bedside. Pt readjusted in bed for comfort.

## 2019-01-07 DIAGNOSIS — M2450 Contracture, unspecified joint: Secondary | ICD-10-CM | POA: Insufficient documentation

## 2019-01-07 DIAGNOSIS — I70269 Atherosclerosis of native arteries of extremities with gangrene, unspecified extremity: Secondary | ICD-10-CM | POA: Insufficient documentation

## 2019-02-02 DEATH — deceased
